# Patient Record
Sex: Female | Born: 1975 | Race: White | Hispanic: Yes | Marital: Single | Smoking: Never smoker
Health system: Southern US, Community
[De-identification: ages and names within clinical notes are randomized; demographics above are authoritative.]

---

## 2006-12-10 ENCOUNTER — Inpatient Hospital Stay (HOSPITAL_COMMUNITY): Admission: AD | Admit: 2006-12-10 | Discharge: 2006-12-14 | Payer: Self-pay | Admitting: Obstetrics

## 2006-12-11 ENCOUNTER — Encounter (INDEPENDENT_AMBULATORY_CARE_PROVIDER_SITE_OTHER): Payer: Self-pay | Admitting: Obstetrics

## 2008-03-01 ENCOUNTER — Inpatient Hospital Stay (HOSPITAL_COMMUNITY): Admission: AD | Admit: 2008-03-01 | Discharge: 2008-03-01 | Payer: Self-pay | Admitting: Obstetrics & Gynecology

## 2008-03-01 ENCOUNTER — Ambulatory Visit: Payer: Self-pay | Admitting: Physician Assistant

## 2008-05-24 ENCOUNTER — Encounter: Payer: Self-pay | Admitting: Family

## 2008-05-24 ENCOUNTER — Ambulatory Visit (HOSPITAL_COMMUNITY): Admission: RE | Admit: 2008-05-24 | Discharge: 2008-05-24 | Payer: Self-pay | Admitting: Obstetrics & Gynecology

## 2008-05-24 ENCOUNTER — Ambulatory Visit: Payer: Self-pay | Admitting: Obstetrics & Gynecology

## 2008-05-24 LAB — CONVERTED CEMR LAB
Clue Cells Wet Prep HPF POC: NONE SEEN
Hemoglobin: 12.1 g/dL (ref 12.0–15.0)
Hepatitis B Surface Ag: NEGATIVE
Lymphocytes Relative: 17 % (ref 12–46)
MCHC: 32.8 g/dL (ref 30.0–36.0)
Monocytes Absolute: 0.3 10*3/uL (ref 0.1–1.0)
Monocytes Relative: 4 % (ref 3–12)
Neutro Abs: 7.5 10*3/uL (ref 1.7–7.7)
RBC: 4.13 M/uL (ref 3.87–5.11)
Rh Type: POSITIVE
Rubella: >500 intl units/mL — ABNORMAL HIGH

## 2008-05-31 ENCOUNTER — Ambulatory Visit: Payer: Self-pay | Admitting: Obstetrics & Gynecology

## 2008-06-12 ENCOUNTER — Ambulatory Visit: Payer: Self-pay | Admitting: Family Medicine

## 2008-06-12 ENCOUNTER — Observation Stay (HOSPITAL_COMMUNITY): Admission: AD | Admit: 2008-06-12 | Discharge: 2008-06-12 | Payer: Self-pay | Admitting: Obstetrics & Gynecology

## 2008-06-13 ENCOUNTER — Ambulatory Visit: Payer: Self-pay | Admitting: Obstetrics & Gynecology

## 2008-06-13 ENCOUNTER — Inpatient Hospital Stay (HOSPITAL_COMMUNITY): Admission: AD | Admit: 2008-06-13 | Discharge: 2008-06-16 | Payer: Self-pay | Admitting: Obstetrics & Gynecology

## 2008-06-13 ENCOUNTER — Encounter: Payer: Self-pay | Admitting: Obstetrics & Gynecology

## 2010-05-21 LAB — COMPREHENSIVE METABOLIC PANEL
AST: 21 U/L (ref 0–37)
Albumin: 2.5 g/dL — ABNORMAL LOW (ref 3.5–5.2)
Chloride: 105 mEq/L (ref 96–112)
Creatinine, Ser: 0.59 mg/dL (ref 0.4–1.2)
GFR calc Af Amer: >60 mL/min (ref >60)
Potassium: 3.7 mEq/L (ref 3.5–5.1)
Total Bilirubin: 0.9 mg/dL (ref 0.3–1.2)
Total Protein: 6.3 g/dL (ref 6.0–8.3)

## 2010-05-21 LAB — URINALYSIS, ROUTINE W REFLEX MICROSCOPIC
Glucose, UA: NEGATIVE mg/dL
pH: 6 (ref 5.0–8.0)

## 2010-05-21 LAB — CBC
HCT: 25.8 % — ABNORMAL LOW (ref 36.0–46.0)
MCHC: 34.7 g/dL (ref 30.0–36.0)
MCV: 90.5 fL (ref 78.0–100.0)
MCV: 90.5 fL (ref 78.0–100.0)
Platelets: 191 10*3/uL (ref 150–400)
RBC: 2.84 MIL/uL — ABNORMAL LOW (ref 3.87–5.11)
RDW: 14.4 % (ref 11.5–15.5)
WBC: 11.2 10*3/uL — ABNORMAL HIGH (ref 4.0–10.5)

## 2010-05-21 LAB — URINE MICROSCOPIC-ADD ON

## 2010-05-22 LAB — POCT URINALYSIS DIP (DEVICE)
Glucose, UA: NEGATIVE mg/dL
Hgb urine dipstick: NEGATIVE
Nitrite: NEGATIVE
Nitrite: NEGATIVE
Protein, ur: NEGATIVE mg/dL
Protein, ur: NEGATIVE mg/dL
Specific Gravity, Urine: 1.015 (ref 1.005–1.030)
Urobilinogen, UA: 0.2 mg/dL (ref 0.0–1.0)
Urobilinogen, UA: 0.2 mg/dL (ref 0.0–1.0)
pH: 6.5 (ref 5.0–8.0)
pH: 7 (ref 5.0–8.0)

## 2010-05-27 LAB — WET PREP, GENITAL
Clue Cells Wet Prep HPF POC: NONE SEEN
Trich, Wet Prep: NONE SEEN
Yeast Wet Prep HPF POC: NONE SEEN

## 2010-05-27 LAB — URINALYSIS, ROUTINE W REFLEX MICROSCOPIC
Bilirubin Urine: NEGATIVE
Ketones, ur: NEGATIVE mg/dL
Nitrite: NEGATIVE
Urobilinogen, UA: 0.2 mg/dL (ref 0.0–1.0)

## 2010-05-27 LAB — URINE CULTURE

## 2010-05-27 LAB — URINE MICROSCOPIC-ADD ON

## 2010-06-06 IMAGING — US US OB LIMITED
1 series · 14 of 17 positions shown · non-contrast
Comparison: none

OBSTETRICAL ULTRASOUND:
 This ultrasound exam was performed in the [HOSPITAL] Ultrasound Department.  The OB US report was generated in the AS system, and faxed to the ordering physician.  This report is also available in [REDACTED] PACS.

[Series 1: us fetal bpp w/o nonstress · non-contrast · 17 acquisitions, 14 frames shown]
[im 1/17]
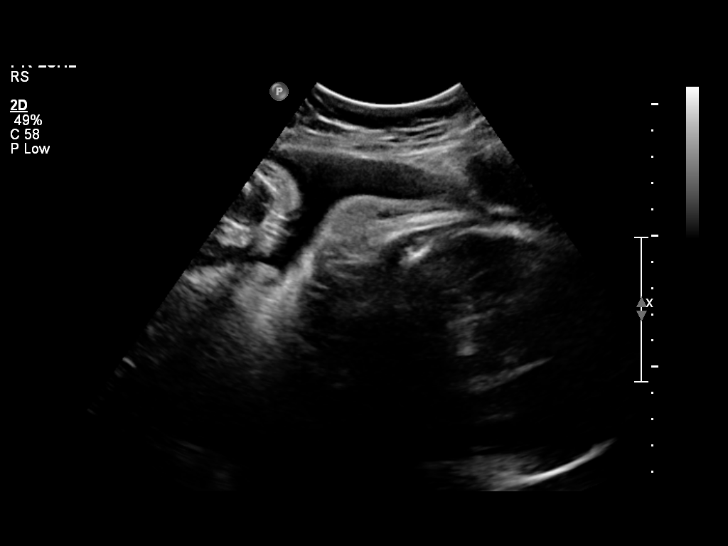
[im 2/17]
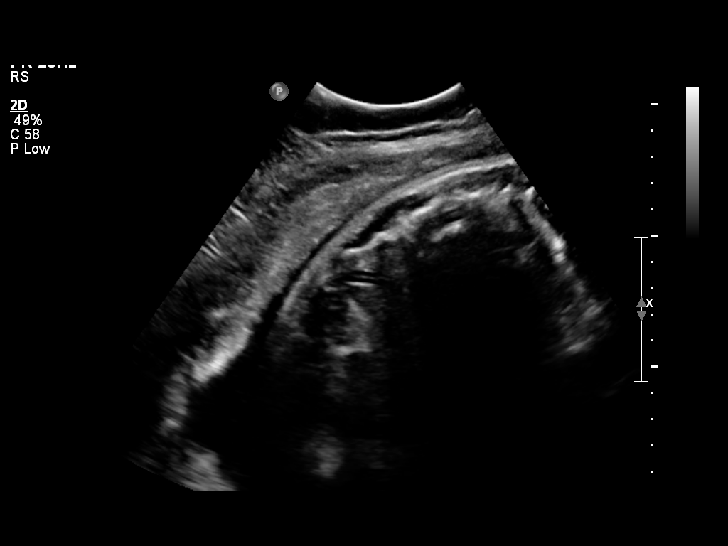
[im 4/17]
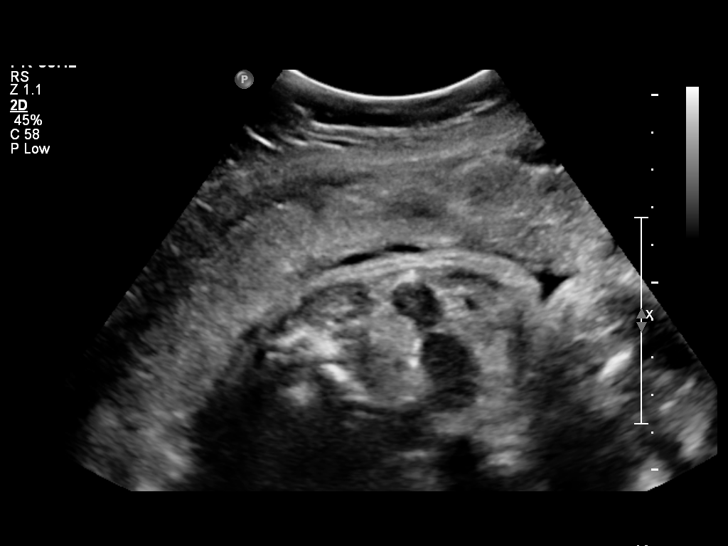
[im 5/17]
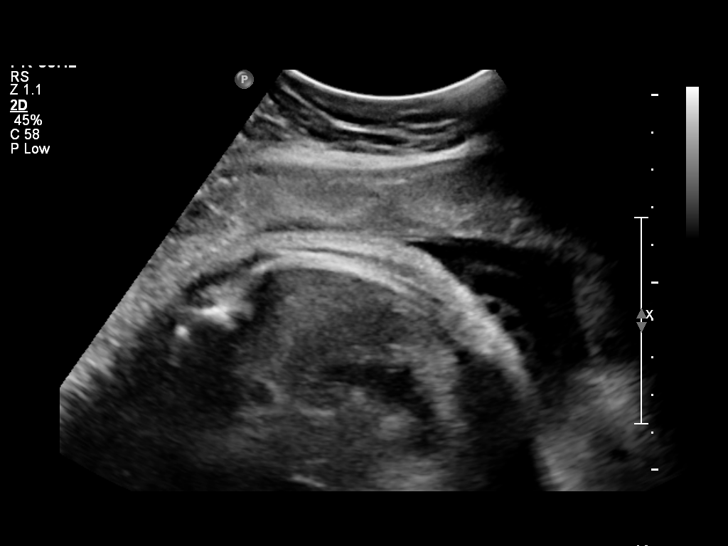
[im 6/17]
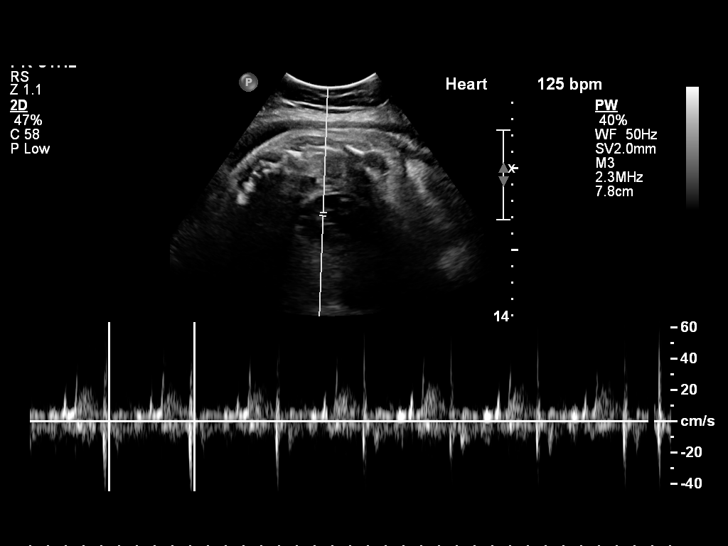
[im 7/17]
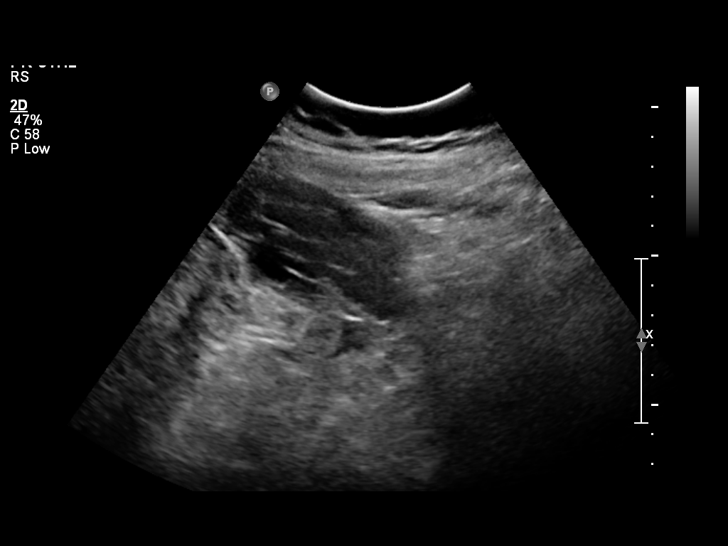
[im 8/17]
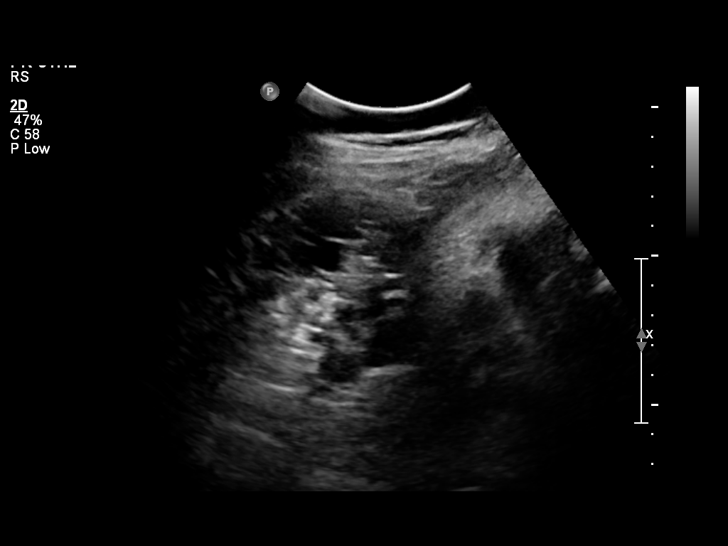
[im 10/17]
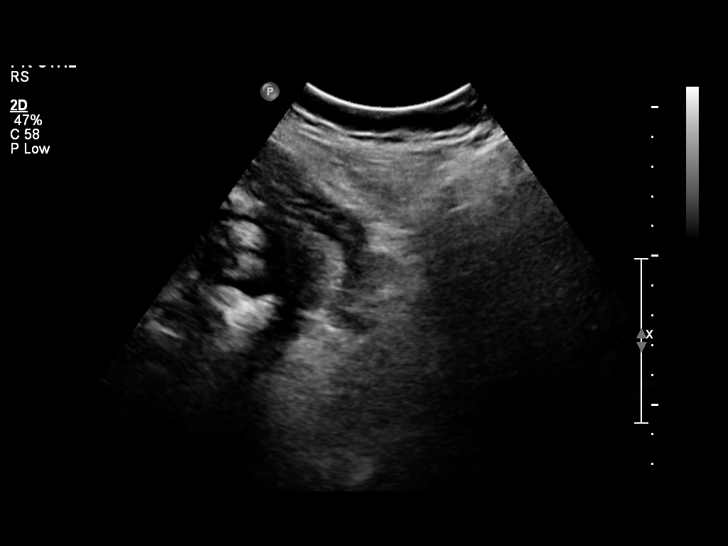
[im 11/17]
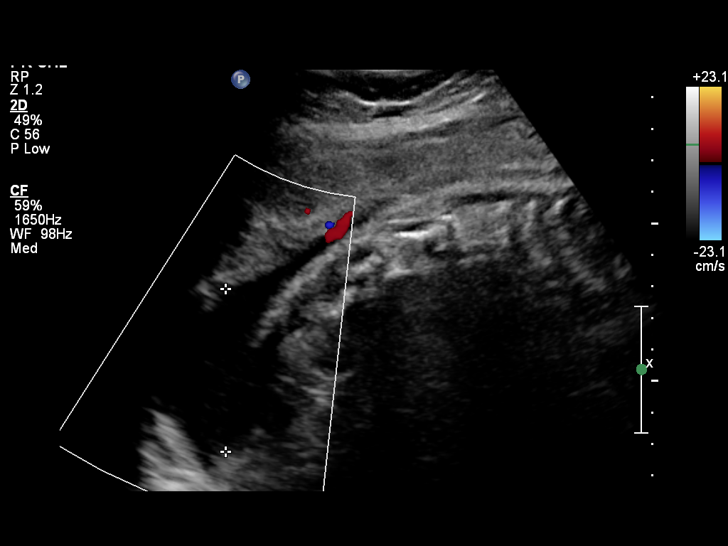
[im 12/17]
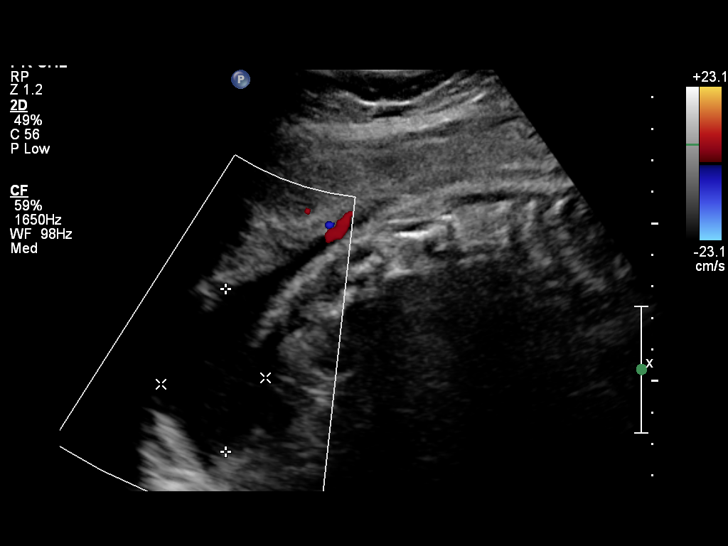
[im 13/17]
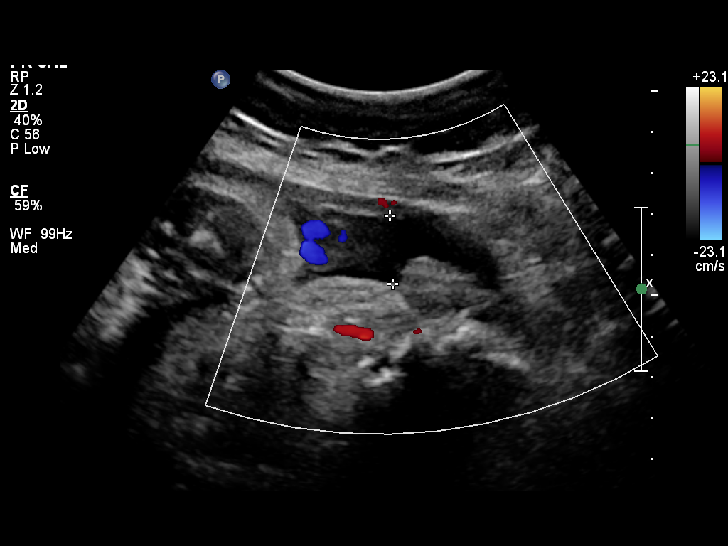
[im 14/17]
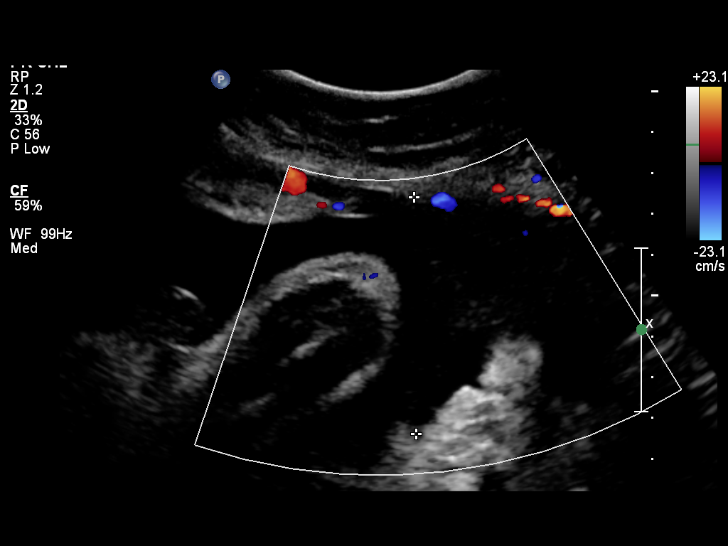
[im 16/17]
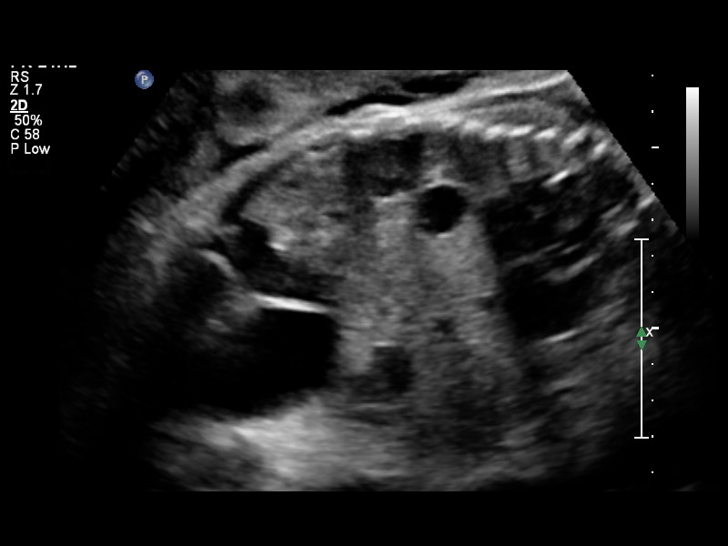
[im 17/17]
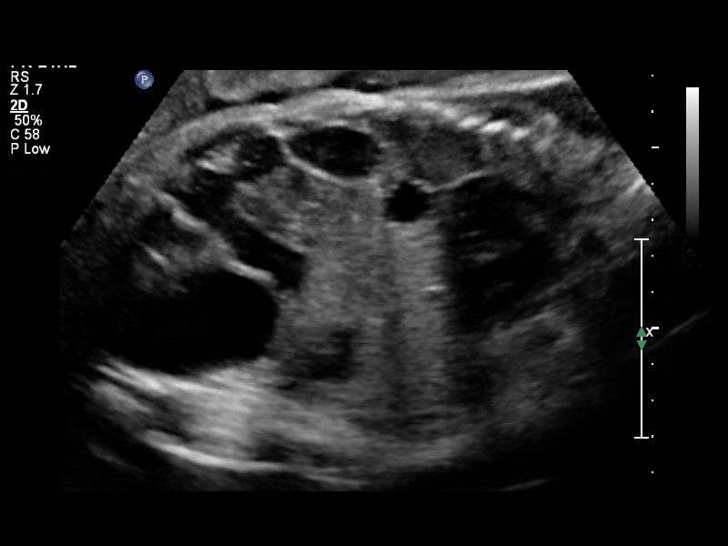

[14 of 17 positions shown; findings below may reference images not displayed]

IMPRESSION: See AS Obstetric US report.

## 2010-06-25 NOTE — Op Note (Signed)
Christy [name], Christy [name]       ACCOUNT NO.:  [id number]   MEDICAL RECORD NO.:  [id number]          PATIENT TYPE:  INP   LOCATION:  9164                          FACILITY:  [facility]   PHYSICIAN:  Kathreen Cosier, M.D.DATE OF BIRTH:  Jun 05, 1975   DATE OF PROCEDURE:  12/11/2006  DATE OF DISCHARGE:                               OPERATIVE REPORT   PREOPERATIVE DIAGNOSIS:  Intrauterine pregnancy at term with prolonged  ruptured membranes and failure to progress in labor.   POSTOPERATIVE DIAGNOSIS:  Intrauterine pregnancy at term with prolonged  ruptured membranes and failure to progress in labor.   SURGEON:  Kathreen Cosier, M.D.   ANESTHESIA:  Epidural.   DESCRIPTION OF PROCEDURE:  The patient was placed on the operating room  in the supine position, abdomen prepped and draped, bladder emptied with  a Foley catheter.  A transverse suprapubic incision was made and carried  down to the rectus fascia, fascia cleaned and incised the length of the  incision.  Rectus muscles were retracted laterally and peritoneum  incised longitudinally.  A transverse incision was made in the  visceroperitoneum above the bladder and bladder mobilized inferiorly.  A  transverse lower uterine incision was made and the patient delivered  from the OP position of a female, Apgars 8/9, weighing 6 pounds 1 ounce.  The team was in attendance.  The placenta was anterior and removed  manually and the uterine cavity cleaned with dry laps.  The uterine  incision was closed in 1 layer with a continuous suture of #1 chromic;  hemostasis was satisfactory.  The bladder flap was reattached with 2-0  chromic.  The uterus was well contracted.  Tubes and ovaries were  normal.  Abdomen was closed in layers, the peritoneum with a continuous  suture of 0 chromic, fascia with a continuous suture of 0 Dexon and the  skin closed with a subcuticular stitch of 4-0 Monocryl.  Blood loss was  600 mL.  The patient tolerated the  procedure well and taken to the  recovery room in good condition.           ______________________________  Kathreen Cosier, M.D.     BAM/MEDQ  D:  12/11/2006  T:  12/13/2006  Job:  [id number]

## 2010-06-25 NOTE — H&P (Signed)
Christy [name], Christy [name]       ACCOUNT NO.:  [id number]   MEDICAL RECORD NO.:  [id number]          PATIENT TYPE:  INP   LOCATION:  9164                          [facility]   PHYSICIAN:  Kathreen Cosier, M.D.DATE OF BIRTH:  1975-08-03   DATE OF ADMISSION:  12/10/2006  DATE OF DISCHARGE:                              HISTORY & PHYSICAL   HISTORY OF PRESENT ILLNESS:  The patient is a 35 year old gravida 1, EDC  December 22, 2006.  She was admitted with a history of ruptured  membranes on the morning of December 10, 2006.  Membranes ruptured at  7:30 p.m. December 09, 2006.  She has positive GBS.  She was having  occasional contractions.  The cervix was 1 cm, 80% vertex, -2 to -3  station.  An IUPC was inserted at that time, and she was started on low-  dose Pitocin.  By 5:15 p.m. on October 30, she was 3 cm, 80% vertex -2  on 60 milliunits of Pitocin and adequate labor.  At 1:15 a.m. on October  31, cervix was unchanged.  It was 4 cm, 100%, -0 station.  She had a few  late decelerations at that time, and they resolved on their own.  At  6:10 a.m. on October 31, she was still 4 cm, 100% , 0 station.  IUPC was  replaced 10:45 a.m. and she was started on high dose Pitocin.  By 2:00  p.m. there was no change in the cervix.  It was decided she would be  delivered by C-section for failure to progress in labor, prolonged  ruptured membranes.  PHYSICAL EXAMINATION/>  GENERAL:  Reveals a well-developed female in no distress.  HEENT: Negative.  LUNGS: Clear.  HEART:  Regular rhythm.  No murmurs or gallops.  BREASTS: No masses.  ABDOMEN: Term size uterus.  Estimated fetal weight 6 pounds 12 ounces.  PELVIC:  As described above.  EXTREMITIES:  Negative.           ______________________________  Kathreen Cosier, M.D.     BAM/MEDQ  D:  12/11/2006  T:  12/13/2006  Job:  [id number]

## 2010-06-25 NOTE — Discharge Summary (Signed)
NAMEDESHANTA, [name]       ACCOUNT NO.:  [id number]   MEDICAL RECORD NO.:  [id number]          PATIENT TYPE:  INP   LOCATION:  9128                          [facility]   PHYSICIAN:  Allie Bossier, MD        DATE OF BIRTH:  12-03-1975   DATE OF ADMISSION:  06/13/2008  DATE OF DISCHARGE:  06/16/2008                               DISCHARGE SUMMARY   DISCHARGING PHYSICIAN:  Scheryl Darter, MD   SURGEON:  Norton Blizzard, MD   ADMISSION DIAGNOSES:  1. Intrauterine pregnancy at 40 and 2/7th weeks.  2. Active labor.  3. Elevated blood pressures.  4. Previous C-section.   DISCHARGE DIAGNOSES:  1. Intrauterine pregnancy at 40 and 2/7th weeks.  2. Active labor.  3. Elevated blood pressures.  4. Previous C-section.  5. Negative preeclampsia workup.  6. Nonreassuring fetal heart rate tracing.  7. Status post repeat low transverse cesarean section of a female      infant.  8. Tight nuchal cord x1 and meconium-stained fluid.  9. Infant for adoption.   HOSPITAL PROCEDURES:  1. Electronic fetal monitoring.  2. Epidural anesthesia.  3. Pitocin augmentation of labor.  4. Repeat low transverse cesarean section.   HOSPITAL COURSE:  The patient was admitted in active labor with cervix  of 3-4 cm.  Amniotomy was performed.  Epidural was placed for pain  control.  PIH labs were done and were normal with no proteinuria.  She  progressed to 4 cm and IUPC was placed.  Pitocin was started for  augmentation of labor.  Decelerations occurred in the afternoon of Jun 13, 2008, and the usual measures were employed to treat those and the  nonreassuring fetal heart rate pattern continued with cervical progress  only to 4 cm and decision was made after a longer decelerations to  proceed with a repeat low transverse cesarean section for a viable female  infant with a tight nuchal cord x1 and meconium-stained fluid.  Apgars  were 9 and 10.  Cord pH was 7.32.  Baby weighed 6 pounds and 10 ounces.  She  was taken to recovery and then to Mother-baby, where she received  routine postop care.  On postop day #1, her pain was well controlled.  Hemoglobin was 8.9.  Blood pressures were normal.  She had a social work  consult regarding her placement of the baby for adoption.  Arrangements  had already been made through [place] adoption services.  On postop  day #2, the patient continued to do well.  Pain was well-controlled with  Percocet.  Blood pressure was 125/76.  Exam was stable.  Routine care  was continued on postop day #3.  The patient was ready to go home.  Blood pressures were within normal limits.  Chest was clear.  Heart is  regular rate and rhythm.  Abdomen was soft and appropriately tender.  Incision was clean, dry, and intact with staples.  Lochia was small.  Extremities were normal.  Staples will be removed prior to discharge.  The patient expressed desire to have Depo-Provera prior to discharge and  continues with her plan for  adoption.  She was deemed to receive full  benefit of her hospital stay and was discharged home.   DISCHARGE MEDICATIONS:  1. Percocet 1-2 p.o. q.4 h. p.r.n.  2. Ibuprofen 600 mg p.o. q.6 h. p.r.n.  3. Prenatal vitamin 1 p.o. daily.  4. Colace 1-2 p.o. daily.  5. Iron sulfate daily for anemia.   DISCHARGE LABORATORY DATA:  Urine was negative for protein.  Sodium 133,  potassium 3.7, creatinine 0.59, AST 21, and ALT 17.  White blood cell  count is 15.2, hemoglobin 8.9, and platelets 136.   DISCHARGE INSTRUCTIONS:  Per handout as per routine.   DISCHARGE FOLLOWUP:  Discharge followup in 6 weeks at [facility] or  [facility] as indicated.   CONDITION AT DISCHARGE:  Good.      Marie L. Williams, C.N.M.      Allie Bossier, MD  Electronically Signed    MLW/MEDQ  D:  06/16/2008  T:  06/16/2008  Job:  [id number]

## 2010-06-25 NOTE — Op Note (Signed)
Christy [name], Christy [name]       ACCOUNT NO.:  [id number]   MEDICAL RECORD NO.:  [id number]          PATIENT TYPE:  INP   LOCATION:  9128                          [facility]   PHYSICIAN:  Norton Blizzard, MD    DATE OF BIRTH:  [birth year]   DATE OF PROCEDURE:  06/13/2008  DATE OF DISCHARGE:                               OPERATIVE REPORT   PREOPERATIVE DIAGNOSES:  1. Intrauterine pregnancy at 40-2/7th weeks gestation.  2. Fetal distress.   POSTOPERATIVE DIAGNOSES:  1. Intrauterine pregnancy at 40-2/7th weeks gestation.  2. Fetal distress.   PROCEDURE:  Repeat low transverse cesarean section via Pfannenstiel.   SURGEON:  Norton Blizzard, MD   ANESTHESIA:  Epidural.   IV FLUIDS:  1500 mL of lactated Ringer's.   ESTIMATED BLOOD LOSS:  800 mL.   URINE OUTPUT:  900 mL of clear yellow urine.   INDICATIONS:  The patient is a 35 year old gravida 2, para 1 at 40-2/7th  weeks gestation who was admitted with spontaneous onset of labor.  The  patient does have a history of a prior cesarean section and opted for  trial of labor after cesarean section for which she was consented in the  office.  Her labor was complicated by episodes of decelerations down to  the 60s lasting less than about 1 minute, that resolved with position  change, and returned to a baseline of 120s and reactive.  She did have a  prolonged deceleration during emesis which resolved to a reactive fetal  heart rate changing in the 120s.  Her cervical exam, advanced from 3 cm  initially to 4 cm, but there was no further cervical change and even  though an intrauterine pressure catheter was placed, she was not  adequate for a long period of time with average MVUs of 150s, and also  because the Pitocin was turned off and on a few times secondary to the  decelerations.  However, around 1820, she had a prolonged 6 minute  deceleration to the 50s with brief return to baseline for two 1 minute  segments within the  deceleration, but the heart rate remained down and  could not resolve after several measures.  Her cervix was still noted to  be 4/80/-2.  She was given a dose of terbutaline 0.25 mg and an urgent  cesarean section was called for fetal distress.  She was counseled about  the risks of cesarean section including but not limited to: bleeding  which might require transfusion, infection which might require  antibiotics, injury to surrounding internal organs, need for additional  procedures including hysterectomy in the event of a life-threatening  bleed and written informed consent was obtained with the aid of a  Spanish interpreter.  The patient was urgently taken to the operating  room and in the operating room, the fetal heart was noted to be back to  the baseline of 130s with moderate variability for at least 2 minutes,  but the decision was made to proceed with cesarean section given the  recurrent periods of nonreassuring fetal heart rate tracing.   FINDINGS:  Viable female infant in a cephalic presentation.  There was 1  tight nuchal cord and heavy meconium-stained fluid.  Apgars were 9 and  10, arterial cord pH was 7.32, weight 6 pounds 10 ounces.  There was  dense adhesions of the omentum to the lower uterine segment on the left  side of the uterus.  There was an intact placenta with three-vessel  cord.  Normal uterus, tubes, and ovaries bilaterally.   SPECIMENS:  Placenta and cord blood.   DISPOSITION:  Specimens to Pathology.   COMPLICATIONS:  None immediately after procedure.   PROCEDURE DETAILS:  The patient was urgently taken from her labor  delivery room to the OR for nonreassuring fetal heart rate tracing.  In  the operating room, the patient was noted have a fetal heart rate  tracing in the 130s that lasted for about 2 minutes.  Given her  recurrent episodes of concerning decelerations and inability to titrate  pitocin to get to adequate contractions secondary to the  decelerations,  the decision was made to proceed with a repeat cesarean section.  She  received 1 g of Ancef intravenously for surgical site infection  prophylaxis.  The patient was placed in a dorsal supine position, and  prepped and draped in a sterile manner.  A Pfannenstiel incision was  made over her preexisting scar with a scalpel and carried through to the  underlying layer of the fascia.  The fascia was incised in the midline,  and this incision was extended bilaterally using Mayo scissors.  Kochers  were then applied to the superior aspect of the fascial incision, and  the underlying rectus muscles were dissected off bluntly and sharply.  A  similar process was carried out on the inferior aspect.  The rectus  muscles were separated in the midline bluntly and the peritoneum was  entered bluntly.  The peritoneal incision was extended superiorly and  inferiorly with care given to avoid bowel or bladder injury.  Attention  was turned to the lower uterine segment, where dense adhesions of the  omentum were noted involving the left side of the lower uterine segment  and also the entire left side of the uterus.  These adhesions were taken  down using electrocautery and suture ligation and good hemostasis was  noted.  A transverse hysterotomy was then made in the lower uterine  segment with the scalpel and extended bilaterally in a blunt fashion.  The infant's head was encountered and delivered.  There was a tight  nuchal cord that was reduced with some difficulty and rest of the  infant's corpus was delivered without complication.  The cord was  clamped and cut, and the infant was handed over to the awaiting  neonatology team.  Heavy meconium staining of the amniotic fluid was  noted.  An arterial cord pH blood sample was obtained.  Fundal massage  was administered, and the placenta delivered intact with three-vessel  cord noted.  The uterus was then cleared of all clots and debris using  a  dry laparotomy sponge.  The hysterotomy was repaired using 0 Monocryl in  a running interlocking fashion.  A second layer of the same suture was  used as an imbricating layer.  Overall, good hemostasis was noted.  The  pelvic gutters were cleared off all clots and debris.  The peritoneum  and the muscles were closed in a mass fashion using interrupted stitches  of 0 Vicryl.  The fascia was reapproximated using 0 PDS in a running  stitch.  The subcutaneous layer  was copiously irrigated, some bleeders  were controlled using electrocautery, and the skin was closed with  staples.  The patient tolerated the procedure well.  Sponge, instrument,  and needle counts were correct x2.  She was taken to the recovery room  in stable condition.      Norton Blizzard, MD  Electronically Signed     UAD/MEDQ  D:  06/13/2008  T:  06/14/2008  Job:  [id number]

## 2010-06-28 NOTE — Discharge Summary (Signed)
NAMEIMOJEAN, YOSHINO       ACCOUNT NO.:  [id number]   MEDICAL RECORD NO.:  [id number]          PATIENT TYPE:  INP   LOCATION:  9118                          [facility]   PHYSICIAN:  Kathreen Cosier, M.D.DATE OF BIRTH:  [birth year]   DATE OF ADMISSION:  12/10/2006  DATE OF DISCHARGE:  12/14/2006                               DISCHARGE SUMMARY   The patient is a 35 year old primigravida, [facility] December 22, 2006, with a  history of ruptured membranes from the night prior to admission.  She  was having occasional contractions.  She had a positive GBS.  On  admission her cervix was 1 cm, 80% vertex, -2 to -3, and she was started  on Pitocin.  She eventually underwent a primary low transverse cesarean  section because of failure to progress in labor and dysfunctional labor.  She had a female, Apgar 8 and 9, weighing 6 pounds 1 ounce from the OP  position.  Postoperatively, she did well.  On admission her hemoglobin  was 11.9, postoperatively 8.7; platelets 227, 171.  RPR negative, HIV  negative, urine negative.  She was discharged on the third postoperative  day ambulatory, on a regular diet, on Tylox for pain, to see me in 6  weeks.   DISCHARGE DIAGNOSIS:  Status post intrauterine pregnancy at term with  prolonged ruptured membranes, failure to progress in labor.           ______________________________  Kathreen Cosier, M.D.     BAM/MEDQ  D:  01/06/2007  T:  01/06/2007  Job:  [id number]

## 2010-11-19 LAB — CBC
HCT: 24.1 — ABNORMAL LOW
Hemoglobin: 8.1 — ABNORMAL LOW
MCHC: 35.2
MCV: 88.3
MCV: 89.2
Platelets: 164
Platelets: 171
RDW: 14.9 — ABNORMAL HIGH
RDW: 15.3 — ABNORMAL HIGH
WBC: 12.1 — ABNORMAL HIGH

## 2010-11-19 LAB — URINE MICROSCOPIC-ADD ON

## 2010-11-19 LAB — DIFFERENTIAL
Basophils Absolute: 0
Basophils Relative: 0
Eosinophils Absolute: 0
Lymphocytes Relative: 5 — ABNORMAL LOW
Monocytes Absolute: 0.3
Neutrophils Relative %: 93 — ABNORMAL HIGH

## 2010-11-19 LAB — URINALYSIS, ROUTINE W REFLEX MICROSCOPIC
Bilirubin Urine: NEGATIVE
Ketones, ur: NEGATIVE
Specific Gravity, Urine: 1.01
pH: 7

## 2010-11-19 LAB — URINE CULTURE
Colony Count: NO GROWTH
Culture: NO GROWTH
Special Requests: NEGATIVE

## 2010-11-20 LAB — RPR: RPR Ser Ql: NONREACTIVE

## 2010-11-20 LAB — CBC
Hemoglobin: 11.6 — ABNORMAL LOW
MCHC: 34.2
MCHC: 34.3
MCV: 87.9
RBC: 3.83 — ABNORMAL LOW
RBC: 3.96
RDW: 14.7 — ABNORMAL HIGH
WBC: 14.5 — ABNORMAL HIGH

## 2016-07-19 ENCOUNTER — Encounter (HOSPITAL_COMMUNITY): Payer: Self-pay

## 2016-08-26 ENCOUNTER — Other Ambulatory Visit: Payer: Self-pay | Admitting: Obstetrics & Gynecology

## 2016-08-26 DIAGNOSIS — Z1231 Encounter for screening mammogram for malignant neoplasm of breast: Secondary | ICD-10-CM

## 2016-09-02 ENCOUNTER — Ambulatory Visit
Admission: RE | Admit: 2016-09-02 | Discharge: 2016-09-02 | Disposition: A | Discharge status: Home / Self Care | Payer: No Typology Code available for payment source | Source: Ambulatory Visit | Attending: Obstetrics & Gynecology | Admitting: Obstetrics & Gynecology

## 2016-09-02 DIAGNOSIS — Z1231 Encounter for screening mammogram for malignant neoplasm of breast: Secondary | ICD-10-CM

## 2016-09-04 ENCOUNTER — Other Ambulatory Visit: Payer: Self-pay | Admitting: Obstetrics & Gynecology

## 2016-09-04 DIAGNOSIS — R928 Other abnormal and inconclusive findings on diagnostic imaging of breast: Secondary | ICD-10-CM

## 2016-09-16 ENCOUNTER — Other Ambulatory Visit: Payer: Self-pay | Admitting: Obstetrics and Gynecology

## 2016-09-16 ENCOUNTER — Other Ambulatory Visit: Payer: Self-pay | Admitting: Obstetrics & Gynecology

## 2016-09-16 DIAGNOSIS — R928 Other abnormal and inconclusive findings on diagnostic imaging of breast: Secondary | ICD-10-CM

## 2016-09-30 ENCOUNTER — Encounter (HOSPITAL_COMMUNITY): Payer: Self-pay

## 2016-09-30 ENCOUNTER — Ambulatory Visit (HOSPITAL_COMMUNITY)
Admission: RE | Admit: 2016-09-30 | Discharge: 2016-09-30 | Disposition: A | Discharge status: Home / Self Care | Payer: Self-pay | Source: Ambulatory Visit | Attending: Obstetrics and Gynecology | Admitting: Obstetrics and Gynecology

## 2016-09-30 ENCOUNTER — Ambulatory Visit
Admission: RE | Admit: 2016-09-30 | Discharge: 2016-09-30 | Disposition: A | Discharge status: Home / Self Care | Payer: No Typology Code available for payment source | Source: Ambulatory Visit | Attending: Obstetrics and Gynecology | Admitting: Obstetrics and Gynecology

## 2016-09-30 VITALS — BP 154/86 | HR 80 | Temp 98.4°F | Ht 62.5 in | Wt 150.2 lb

## 2016-09-30 DIAGNOSIS — R928 Other abnormal and inconclusive findings on diagnostic imaging of breast: Secondary | ICD-10-CM

## 2016-09-30 DIAGNOSIS — Z1239 Encounter for other screening for malignant neoplasm of breast: Secondary | ICD-10-CM

## 2016-09-30 NOTE — Patient Instructions (Signed)
Explained breast self awareness with Smitty Knudsen. Patient did not need a Pap smear today due to last Pap smear was in June 2018 per patient. Let her know BCCCP will cover Pap smears every 3 years unless has a history of abnormal Pap smears. Referred patient to the [facility] for right diagnostic mammogram and breast ultrasound per recommendation. Appointment scheduled for Tuesday, September 30, 2016 at 0850. Smitty Knudsen verbalized understanding.  Gil Ingwersen, Kathaleen Maser, RN 8:44 AM

## 2016-09-30 NOTE — Progress Notes (Signed)
Patient referred to [facility] by the [facility] for additional imaging of the right breast. Screening mammogram completed 09/02/2016.   Pap Smear: Pap smear not completed today. Last Pap smear was in June 2018 at the [facility] and normal. Per patient has no history of an abnormal Pap smear. No Pap smear results are in EPIC.  Physical exam: Breasts Breasts symmetrical. No skin abnormalities bilateral breasts. No nipple retraction bilateral breasts. No nipple discharge bilateral breasts. No lymphadenopathy. No lumps palpated bilateral breasts. No complaints of pain or tenderness on exam. Referred patient to the [facility] for right diagnostic mammogram and breast ultrasound per recommendation. Appointment scheduled for Tuesday, September 30, 2016 at 0850.        Pelvic/Bimanual No Pap smear completed today since last Pap smear was in June 2018 per patient. Pap smear not indicated per BCCCP guidelines.   Smoking History: Patient has never smoked.  Patient Navigation: Patient education provided. Access to services provided for patient through [facility] program. Spanish interpreter provided.  Used Spanish interpreter [employer] from [place].

## 2017-09-02 ENCOUNTER — Other Ambulatory Visit: Payer: Self-pay | Admitting: Obstetrics and Gynecology

## 2017-09-02 DIAGNOSIS — Z1231 Encounter for screening mammogram for malignant neoplasm of breast: Secondary | ICD-10-CM

## 2017-09-10 ENCOUNTER — Ambulatory Visit (INDEPENDENT_AMBULATORY_CARE_PROVIDER_SITE_OTHER): Payer: Self-pay

## 2017-09-10 DIAGNOSIS — Z3202 Encounter for pregnancy test, result negative: Secondary | ICD-10-CM

## 2017-09-10 NOTE — Progress Notes (Signed)
Video Interpreter # [id number] Pt here today for pregnancy test resulted negative.  Pt stated that she took at home tests that showed both negative and positive "I want to know what's going on".  I informed pt that unfortunately her test came back negative and that I encouraged her to take a digital pregnancy test in a week.  I also informed her that there are several reasons on why her period could be late.  Pt stated understanding with no further questions.

## 2017-09-11 LAB — POCT PREGNANCY, URINE: PREG TEST UR: NEGATIVE

## 2017-09-14 NOTE — Progress Notes (Signed)
I have reviewed the chart and agree with nursing staff's documentation of this patient's encounter.  La Dibella, MD 09/14/2017 11:02 AM    

## 2017-10-29 ENCOUNTER — Ambulatory Visit
Admission: RE | Admit: 2017-10-29 | Discharge: 2017-10-29 | Disposition: A | Discharge status: Home / Self Care | Payer: No Typology Code available for payment source | Source: Ambulatory Visit | Attending: Obstetrics and Gynecology | Admitting: Obstetrics and Gynecology

## 2017-10-29 ENCOUNTER — Ambulatory Visit (HOSPITAL_COMMUNITY)
Admission: RE | Admit: 2017-10-29 | Discharge: 2017-10-29 | Disposition: A | Discharge status: Home / Self Care | Payer: Self-pay | Source: Ambulatory Visit | Attending: Obstetrics and Gynecology | Admitting: Obstetrics and Gynecology

## 2017-10-29 ENCOUNTER — Encounter (HOSPITAL_COMMUNITY): Payer: Self-pay

## 2017-10-29 VITALS — BP 118/76 | Wt 150.6 lb

## 2017-10-29 DIAGNOSIS — Z1239 Encounter for other screening for malignant neoplasm of breast: Secondary | ICD-10-CM

## 2017-10-29 DIAGNOSIS — Z1231 Encounter for screening mammogram for malignant neoplasm of breast: Secondary | ICD-10-CM

## 2017-10-29 NOTE — Progress Notes (Signed)
No complaints today.   Pap Smear: Pap smear not completed today. [facility] and normal per patient. Per patient has no history of an abnormal Pap smear. No Pap smear results are in Epic.  Physical exam: Breasts Breasts symmetrical. No skin abnormalities bilateral breasts. No nipple retraction bilateral breasts. No nipple discharge bilateral breasts. No lymphadenopathy. No lumps palpated bilateral breasts. No complaints of pain or tenderness on exam. [facility]. Appointment scheduled for Thursday, October 29, 2017 at 1240.        Pelvic/Bimanual No Pap smear completed today since last Pap smear was in June 2018 per patient. Pap smear not indicated per BCCCP guidelines.   Smoking History: Patient has never smoked.  Patient Navigation: Patient education provided. [facility]. Spanish interpreter provided.   Breast and Cervical Cancer Risk Assessment: Patient has no family history of breast cancer, known genetic mutations, or radiation treatment to the chest before age 41. Patient has no history of cervical dysplasia, immunocompromised, or DES exposure in-utero.  Risk Assessment    Risk Scores      10/29/2017   Last edited by: Lynnell DikeHolland, Sabrina H, LPN   5-year risk: 0.6 %   Lifetime risk: 9.6 %          Used Spanish interpreter Natale LayErika McReynolds from [employer].

## 2017-10-29 NOTE — Patient Instructions (Signed)
Explained breast self awareness with [name]. Patient did not need a Pap smear today due to last Pap smear was in June 2018 per patient. Let her know BCCCP will cover Pap smears every 3 years unless has a history of abnormal Pap smears. [facility] for a screening mamm[facility]. Appointment scheduled for Thursday, October 29, 2017 at 1240. Let patient know will follow up with her within the next couple weeks with results of mammogram by letter or phone. [name] verbalized understanding.  Desirie Minteer, Kathaleen Maserhristine Poll, RN 10:43 AM

## 2017-10-30 ENCOUNTER — Encounter (HOSPITAL_COMMUNITY): Payer: Self-pay | Admitting: *Deleted

## 2017-11-02 ENCOUNTER — Other Ambulatory Visit (HOSPITAL_COMMUNITY): Payer: Self-pay | Admitting: *Deleted

## 2017-11-02 DIAGNOSIS — Z0000 Encounter for general adult medical examination without abnormal findings: Secondary | ICD-10-CM

## 2017-11-04 ENCOUNTER — Inpatient Hospital Stay: Payer: No Typology Code available for payment source

## 2017-11-04 ENCOUNTER — Inpatient Hospital Stay: Payer: No Typology Code available for payment source | Attending: Obstetrics and Gynecology | Admitting: *Deleted

## 2017-11-04 VITALS — BP 110/65 | Ht 62.0 in | Wt 149.0 lb

## 2017-11-04 DIAGNOSIS — Z0000 Encounter for general adult medical examination without abnormal findings: Secondary | ICD-10-CM

## 2017-11-04 LAB — HEMOGLOBIN A1C
Hgb A1c MFr Bld: 5.3 % (ref 4.8–5.6)
Mean Plasma Glucose: 105.41 mg/dL

## 2017-11-04 LAB — LIPID PANEL
CHOL/HDL RATIO: 3.1 ratio
Cholesterol: 193 mg/dL (ref 0–200)
HDL: 63 mg/dL (ref >40)
LDL CALC: 100 mg/dL — AB (ref 0–99)
Triglycerides: 149 mg/dL (ref <150)
VLDL: 30 mg/dL (ref 0–40)

## 2017-11-04 NOTE — Addendum Note (Signed)
Addended by: Tyson DenseAYLOR, Marialy Urbanczyk B on: 11/04/2017 11:50 AM   Modules accepted: Orders

## 2017-11-04 NOTE — Progress Notes (Signed)
[facility] initial screening  Spanish Interpreter -Ladell Pier, Language Resources  Clinical Measurement:  Height:  62in Weight: 149lb  Blood Pressure:  110/70 Blood Pressure #2: 110/65   Fasting Labs Drawn Today, will review with patient when they result.  Medical History:  Patient states that she has not been diagnosed with high cholesterol, high blood pressure, diabetes or heart disease.  Medications:  Patients states she is not taking any medications for high cholesterol, high blood pressure or diabetes.  She is not taking aspirin daily to prevent heart attack or stroke.    Blood pressure, self measurement:  Patients states she does not measure blood pressure at home.    Nutrition:  Patient states she eats 3 cups of fruit and 3 cups of vegetables in an average day.  Patient states she does  eat fish regularly, she eats about a  half a serving of whole grains daily. She drinks less than 36 ounces of beverages with added sugar weekly.  She is not currently watching her sodium intake.  She has not had any drinks containing alcohol in the last seven days.    Physical activity:  Patient states that she gets 600 minutes of moderate exercise in a week.  She gets 100 minutes of vigorous exercise per week.   Smoking status:  Patient states she has never smoked and is not around any smokers.     Quality of life:  Patient states that she has had 0 bad physical days out of the last 30 days. In the last 2 weeks, she has had 0 days that she has felt down or depressed. She has had 0 days in the last 2 weeks that she has had little interest or pleasure in doing things.  Risk reduction and counseling:  Patient states she wants to lose weight and increase fruit and vegetable intake.  I encouraged her to continue with current exercise regimen and increase vegetable and fruit intake.  Navigation:  I will notify patient of lab results.  Patient is aware of 2 more health coaching sessions and a follow up.

## 2017-11-04 NOTE — Addendum Note (Signed)
Addended by: Rowan Blaker B on: 11/04/2017 11:50 AM   Modules accepted: Orders  

## 2017-11-06 ENCOUNTER — Telehealth (HOSPITAL_COMMUNITY): Payer: Self-pay | Admitting: *Deleted

## 2017-11-06 NOTE — Telephone Encounter (Signed)
Health coaching 2   Spanish interpreter-  [name], [employer]  Labs-LDL cholesterol 100, cholesterol 193, triglycerides, HDL cholesterol 63, hemoglobin A1C 5.3,mean plasma glucose 105.41  Patient is aware and understands her lab results.  Goals-  Patient states she walks and runs daily.  She eats more vegetables and fruit about  6 daily.  I encouraged the patient to continue with her exercise regimen and continuing to eat the amount of fruits and vegetables.  I encouraged her to eat heart healthy oils such as olive and canola oil.  Navigation: Patient is aware of 1 more health coaching sessions and a follow up.  Time- 10 minutes

## 2017-12-31 ENCOUNTER — Telehealth (HOSPITAL_COMMUNITY): Payer: Self-pay | Admitting: *Deleted

## 2017-12-31 NOTE — Telephone Encounter (Signed)
Health Coaching 3   Spanish interpreter- Celesta AverLucia De [name], Language [employer]   Goals-  Patient states that she is running 45 minutes 3 times per week.  Patent states that eat a lot of fruits ,vegetables, fish (cod) and grilled chicken.  Patient states that she is avoiding sugar except when she has a cup of coffee every other day. I encouraged patient to continue her healthy eating behaviors and exercise regimen. Patient states that she has lost 20lbs in 2 months.  New goal- Patient states that she wants to lose an additional  10lbs.   Barrier to reaching goal- N/A  Strategies to overcome barriers- N/A  Navigation:  Patient is aware of  a follow up session.  Time- 10 minutes

## 2019-10-04 ENCOUNTER — Other Ambulatory Visit: Payer: Self-pay | Admitting: Obstetrics and Gynecology

## 2019-10-04 DIAGNOSIS — Z1231 Encounter for screening mammogram for malignant neoplasm of breast: Secondary | ICD-10-CM

## 2019-10-23 IMAGING — MG DIGITAL SCREENING BILATERAL MAMMOGRAM WITH TOMO AND CAD
8 series · 9 of 24 positions shown · non-contrast
Comparison: Previous exam(s).

CLINICAL DATA: Screening.

EXAM:
DIGITAL SCREENING BILATERAL MAMMOGRAM WITH TOMO AND CAD

[R CC synth-2D]
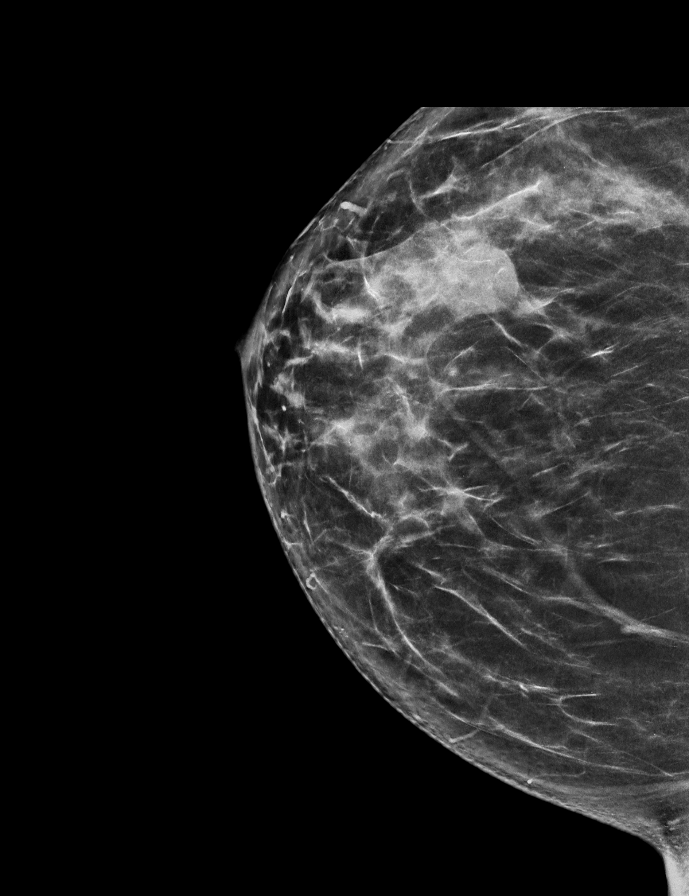

[L MLO synth-2D]
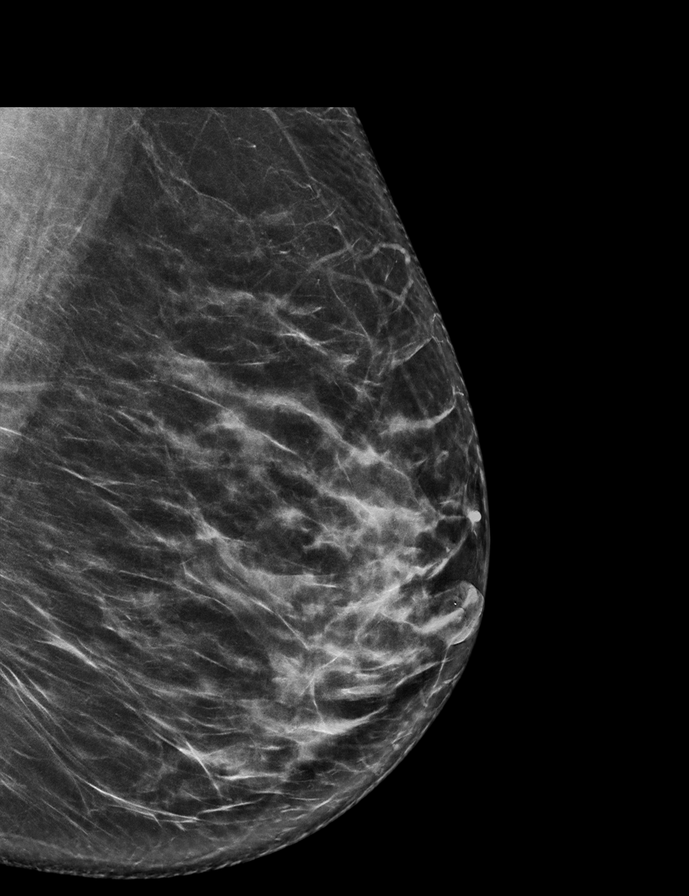

[L CC synth-2D]
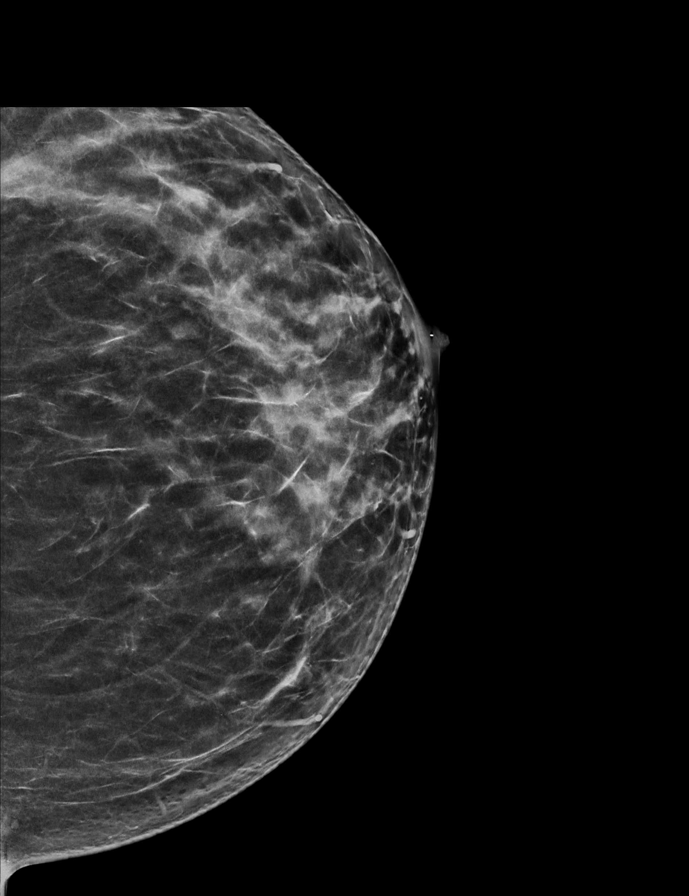

[R MLO synth-2D]
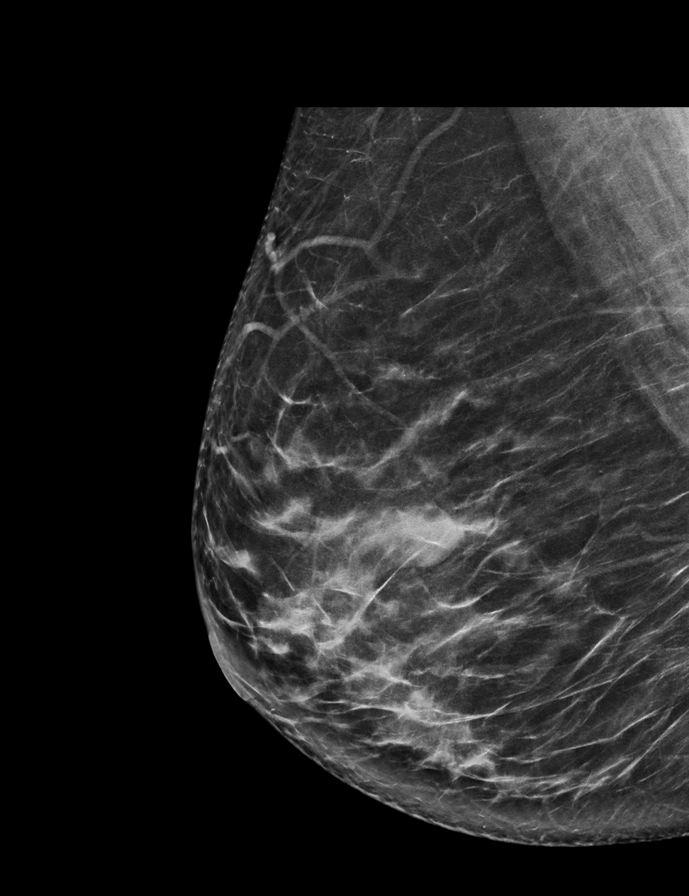

[L MLO tomo · 2 of 78 frames shown]
[frame 26/78]
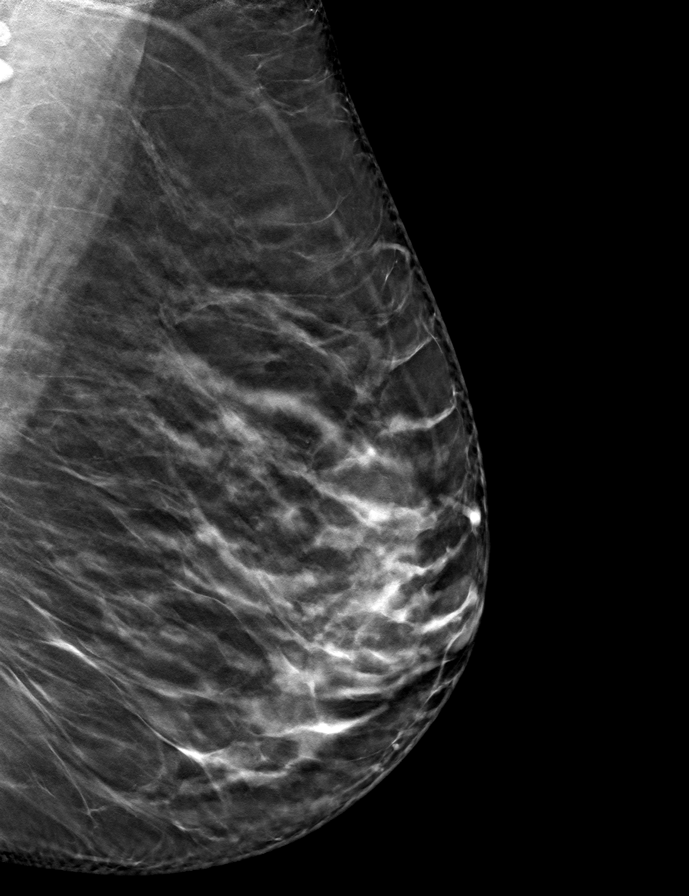
[frame 39/78]
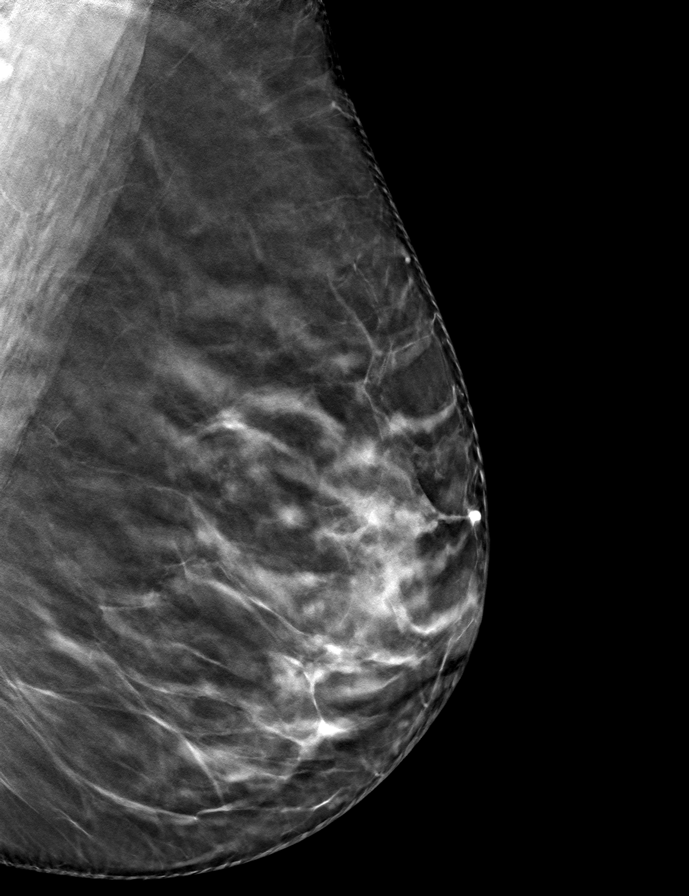

[L CC tomo · tomo slice 36/71.0]
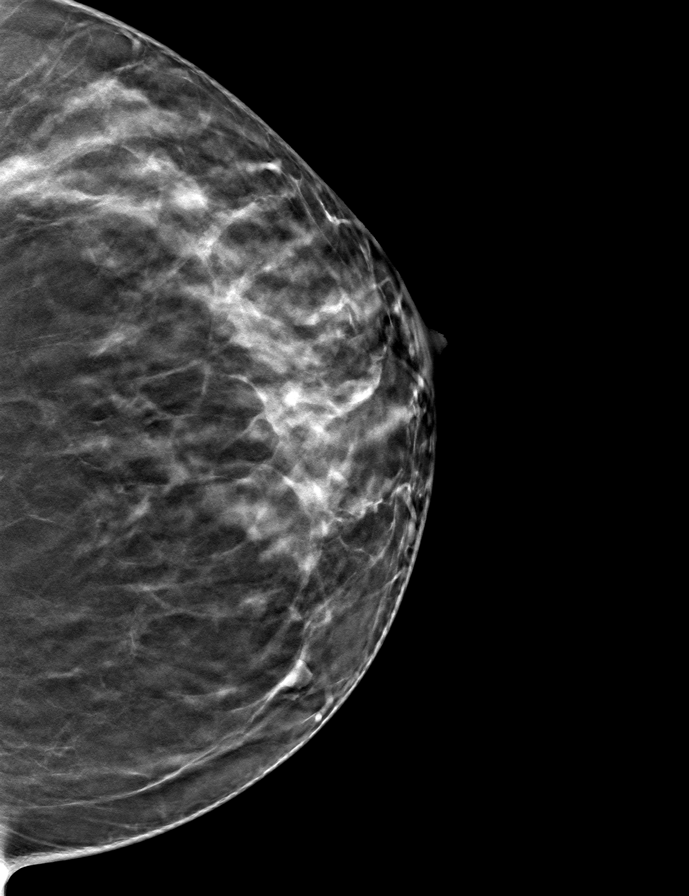

[R CC tomo · tomo slice 37/72.0]
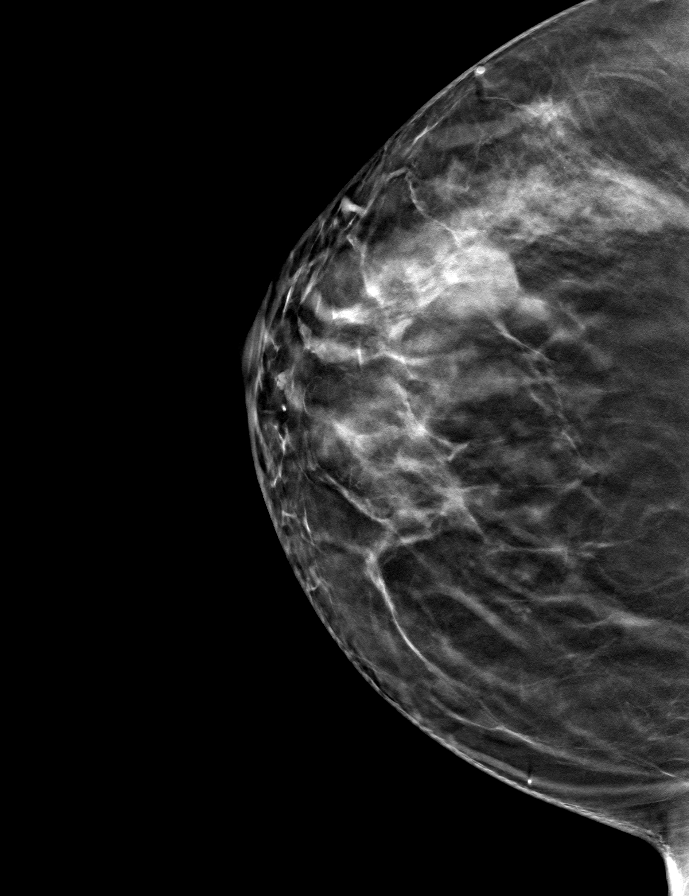

[R MLO tomo · tomo slice 39/77.0]
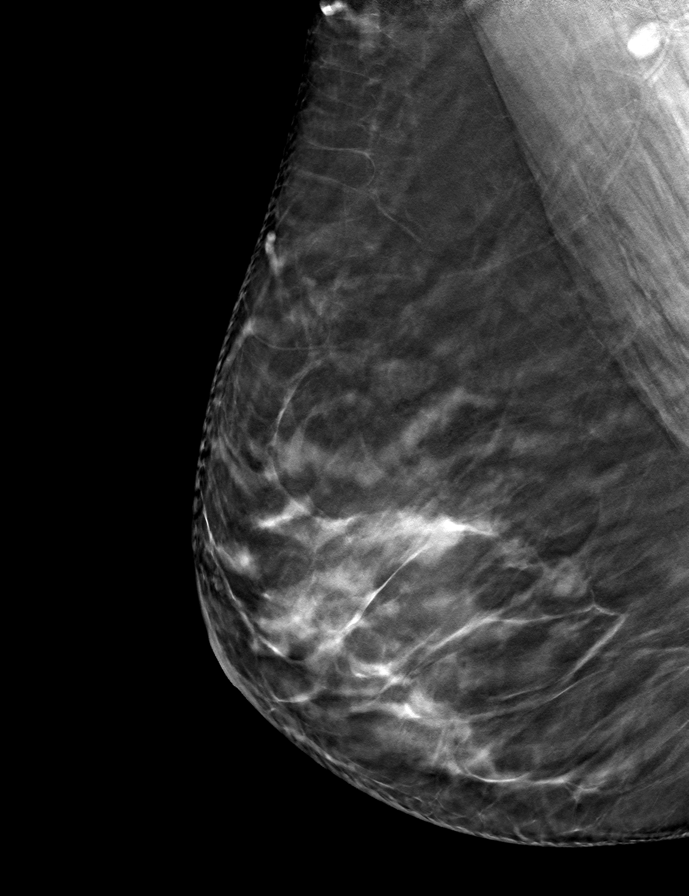

[9 of 24 positions shown; findings below may reference images not displayed]

ACR Breast Density Category c: The breast tissue is heterogeneously
dense, which may obscure small masses.
FINDINGS: There are no findings suspicious for malignancy. Images were
processed with CAD.
IMPRESSION: No mammographic evidence of malignancy. A result letter of this
screening mammogram will be mailed directly to the patient.

RECOMMENDATION:
Screening mammogram in one year. (Code:[id number])

BI-RADS CATEGORY  1: Negative.

## 2019-11-03 ENCOUNTER — Ambulatory Visit: Payer: No Typology Code available for payment source

## 2019-11-24 ENCOUNTER — Ambulatory Visit: Payer: Self-pay | Admitting: *Deleted

## 2019-11-24 ENCOUNTER — Other Ambulatory Visit: Payer: Self-pay

## 2019-11-24 ENCOUNTER — Ambulatory Visit
Admission: RE | Admit: 2019-11-24 | Discharge: 2019-11-24 | Disposition: A | Discharge status: Home / Self Care | Payer: No Typology Code available for payment source | Source: Ambulatory Visit | Attending: Obstetrics and Gynecology | Admitting: Obstetrics and Gynecology

## 2019-11-24 VITALS — BP 148/84 | Temp 97.1°F | Wt 153.2 lb

## 2019-11-24 DIAGNOSIS — Z1231 Encounter for screening mammogram for malignant neoplasm of breast: Secondary | ICD-10-CM

## 2019-11-24 DIAGNOSIS — Z01419 Encounter for gynecological examination (general) (routine) without abnormal findings: Secondary | ICD-10-CM

## 2019-11-24 NOTE — Patient Instructions (Addendum)
Explained breast self awareness with Smitty Knudsen. Pap smear completed today. Let her know BCCCP will cover Pap smears and HPV typing every 5 years unless has a history of abnormal Pap smears. Referred patient to the Breast Center of [facility] for a screening mammogram on the mobile unit. Appointment scheduled Thursday, November 24, 2019 at 1510. Patient escorted to mobile unit following BCCCP appointment for her screening mammogram. Let patient know will follow up with her within the next couple weeks with results of her Pap smear by letter or phone. Informed patient that the Breast Center will follow-up with her within the next couple of weeks with results of her mammogram by letter or phone. Smitty Knudsen verbalized understanding.  Greely Atiyeh, Kathaleen Maser, RN 2:38 PM

## 2019-11-24 NOTE — Progress Notes (Signed)
Christy [name] is a 44 y.o. G2P2 female who presents to [facility] clinic today with no complaints.    Pap Smear: Pap smear completed today. Last Pap smear was 08/08/2016 at the [facility] clinic and was normal. Per patient has no history of an abnormal Pap smear. Last Pap smear result is available in Epic.   Physical exam: Breasts Breasts symmetrical. No skin abnormalities bilateral breasts. Slight bilateral nipple retraction that per patient is normal for her. No nipple discharge bilateral breasts. No lymphadenopathy. No lumps palpated bilateral breasts. No complaints of pain or tenderness on exam.      Pelvic/Bimanual Ext Genitalia No lesions, no swelling and no discharge observed on external genitalia.        Vagina Vagina pink and normal texture. No lesions or discharge observed in vagina.        Cervix Cervix is present. Cervix pink and of normal texture. No discharge observed.    Uterus Uterus is present and palpable. Uterus in normal position and normal size.        Adnexae Bilateral ovaries present and palpable. No tenderness on palpation.         Rectovaginal No rectal exam completed today since patient had no rectal complaints. No skin abnormalities observed on exam.     Smoking History: Patient has never smoked.   Patient Navigation: Patient education provided. Access to services provided for patient through [place] program. Spanish interpreter Christy [name] from [facility] provided.   Breast and Cervical Cancer Risk Assessment: Patient does not have family history of breast cancer, known genetic mutations, or radiation treatment to the chest before age 52. Patient does not have history of cervical dysplasia, immunocompromised, or DES exposure in-utero.  Risk Assessment    Risk Scores      11/24/2019 10/29/2017   Last edited by: Christy Rutherford, LPN Stoney Bang H, LPN   5-year risk: 0.7 % 0.6 %   Lifetime risk: 9.4 % 9.6 %           A: BCCCP exam with pap smear No complaints.  P: Referred patient to the [facility] of [facility] for a screening mammogram on the mobile unit. Appointment scheduled Thursday, November 24, 2019 at 1510.  Christy Heidelberg, RN 11/24/2019 2:37 PM

## 2019-11-30 LAB — CYTOLOGY - PAP
Comment: NEGATIVE
Diagnosis: UNDETERMINED — AB
High risk HPV: NEGATIVE

## 2019-12-01 ENCOUNTER — Telehealth: Payer: Self-pay

## 2019-12-01 NOTE — Telephone Encounter (Signed)
Via [name], Spanish Interpreter, Patient informed Pap test revealed ASC-US with negative HPV, needs repeat pap in 1 year. Patient verbalized understanding.

## 2019-12-14 ENCOUNTER — Other Ambulatory Visit: Payer: Self-pay

## 2019-12-14 ENCOUNTER — Inpatient Hospital Stay: Payer: Self-pay | Attending: Obstetrics and Gynecology | Admitting: *Deleted

## 2019-12-14 VITALS — BP 110/82 | Ht 61.0 in | Wt 159.3 lb

## 2019-12-14 DIAGNOSIS — Z0000 Encounter for general adult medical examination without abnormal findings: Secondary | ICD-10-CM

## 2019-12-14 NOTE — Progress Notes (Addendum)
Wisewoman initial screening   Interpreter- [name], [place]   Clinical Measurement:  Vitals:   12/14/19 0907 12/14/19 1116  BP: 108/80 110/82   Fasting Labs Drawn Today, will review with patient when they result.   Medical History:  Patient states that she does not have high cholesterol, does not have high blood pressure and she does not have diabetes.  Medications:  Patient states that she does not take medication to lower cholesterol, blood pressure or blood sugar.  Patient does not take an aspirin a day to help prevent a heart attack or stroke.   Blood pressure, self measurement: Patient states that she does not measure blood pressure from home. She checks her blood pressure N/A. She shares her readings with a health care provider: N/A.   Nutrition: Patient states that on average she eats 3 cups of fruit and 4 cups of vegetables per day. Patient states that she does not eat fish at least 2 times per week. Patient eats more than half servings of whole grains. Patient drinks less than 36 ounces of beverages with added sugar weekly: yes. Patient is currently watching sodium or salt intake: yes. In the past 7 days patient has had 0 drinks containing alcohol. On average patient drinks 0 drinks containing alcohol per day.      Physical activity:  Patient states that she gets 75 minutes of moderate and 75 minutes of vigorous physical activity each week.  Smoking status:  Patient states that she has has never smoked .   Quality of life:  Over the past 2 weeks patient states that she had little interest or pleasure in doing things: not at all. She has been feeling down, depressed or hopeless:not at all.    Risk reduction and counseling:    Explained to patient about the daily recommendation for fruits and vegetables. Explained that the recommendation is for 2 cups of fruit and 3 cups of vegetables per day. Showed patient what one serving would look like. Explained the importance of heart  healthy fish in weekly diet. Gave recommendations for salmon, tuna, mackerel, sea bass or sardines twice a week. Discussed what whole grains are and how they are a part of a heart healthy diet. Gave suggestions for whole wheat bread or pasta, whole grain cereals, brown rice or oatmeal. Also spoke with patient about portion control and measuring out foods to get a more accurate idea of how much patient is eating.   Navigation:  I will notify patient of lab results.  Patient is aware of 2 more health coaching sessions and a follow up. Provided patient with transportation to get home from appointment.  Time: 25 minutes

## 2019-12-15 LAB — LIPID PANEL
Chol/HDL Ratio: 3.4 ratio (ref 0.0–4.4)
Cholesterol, Total: 175 mg/dL (ref 100–199)
HDL: 52 mg/dL (ref >39)
LDL Chol Calc (NIH): 97 mg/dL (ref 0–99)
Triglycerides: 151 mg/dL — ABNORMAL HIGH (ref 0–149)
VLDL Cholesterol Cal: 26 mg/dL (ref 5–40)

## 2019-12-15 LAB — HEMOGLOBIN A1C
Est. average glucose Bld gHb Est-mCnc: 111 mg/dL
Hgb A1c MFr Bld: 5.5 % (ref 4.8–5.6)

## 2019-12-15 LAB — GLUCOSE, RANDOM: Glucose: 88 mg/dL (ref 65–99)

## 2019-12-19 ENCOUNTER — Telehealth: Payer: Self-pay

## 2019-12-19 NOTE — Telephone Encounter (Signed)
Health coaching 2   interpreter- [name], [employer]   Labs- 175 cholesterol, 97 LDL cholesterol, 151 triglycerides, 52 HDL cholesterol, 5.5 hemoglobin A1C, 88 mean plasma glucose. Patient understands and is aware of her lab results.    Goals-    1. Reduce the amount of fried and fatty foods consumed. 2. Avoid trans fats and foods high in saturated fat. 3. Reduce the amount of sugary foods and beverages consumed. 4. Increase the amount of healthy fats consumed. 5. Increase the amount of whole grains and fiber rich foods consumed.  Encouraged patient to continue with exercise routine/ daily exercise.    Navigation:  Patient is aware of 1 more health coaching sessions and a follow up.   Time- 10 minutes

## 2020-10-22 ENCOUNTER — Other Ambulatory Visit: Payer: Self-pay

## 2020-10-22 DIAGNOSIS — Z1231 Encounter for screening mammogram for malignant neoplasm of breast: Secondary | ICD-10-CM

## 2020-10-25 ENCOUNTER — Other Ambulatory Visit: Payer: Self-pay | Admitting: Obstetrics and Gynecology

## 2020-10-25 DIAGNOSIS — Z1231 Encounter for screening mammogram for malignant neoplasm of breast: Secondary | ICD-10-CM

## 2020-12-13 ENCOUNTER — Other Ambulatory Visit: Payer: Self-pay

## 2020-12-13 ENCOUNTER — Ambulatory Visit: Payer: Self-pay | Admitting: *Deleted

## 2020-12-13 ENCOUNTER — Encounter (INDEPENDENT_AMBULATORY_CARE_PROVIDER_SITE_OTHER): Payer: Self-pay

## 2020-12-13 ENCOUNTER — Ambulatory Visit
Admission: RE | Admit: 2020-12-13 | Discharge: 2020-12-13 | Disposition: A | Discharge status: Home / Self Care | Payer: No Typology Code available for payment source | Source: Ambulatory Visit | Attending: Obstetrics & Gynecology | Admitting: Obstetrics & Gynecology

## 2020-12-13 VITALS — BP 138/86 | Wt 155.5 lb

## 2020-12-13 DIAGNOSIS — Z1239 Encounter for other screening for malignant neoplasm of breast: Secondary | ICD-10-CM

## 2020-12-13 NOTE — Progress Notes (Signed)
[name] is a 45 y.o. G2P2 female who presents to [facility] clinic today with no complaints.    Pap Smear: Pap smear not completed today. Last Pap smear was 11/24/2019 at [facility] clinic and was abnormal - ASCUS with negative HPV . Per patient has no history of an abnormal Pap smear prior to her most recent Pap smear. Per ASCCP guidelines next Pap smear due in 3 years. Last Pap smear result is available in Epic.   Physical exam: Breasts Breasts symmetrical. No skin abnormalities bilateral breasts. No nipple retraction bilateral breasts. No nipple discharge bilateral breasts. No lymphadenopathy. No lumps palpated bilateral breasts. Complaints of mild right outer breast tenderness on exam.     MS DIGITAL SCREENING BILATERAL  Result Date: 09/03/2016 CLINICAL DATA:  Screening. EXAM: DIGITAL SCREENING BILATERAL MAMMOGRAM WITH CAD COMPARISON:  None. ACR Breast Density Category c: The breast tissue is heterogeneously dense, which may obscure small masses. FINDINGS: In the right breast, a possible mass warrants further evaluation. In the left breast, no findings suspicious for malignancy. Images were processed with CAD. IMPRESSION: Further evaluation is suggested for possible mass in the right breast. RECOMMENDATION: Diagnostic mammogram and possibly ultrasound of the right breast. (Code:[id number]) The patient will be contacted regarding the findings, and additional imaging will be scheduled. BI-RADS CATEGORY  0: Incomplete. Need additional imaging evaluation and/or prior mammograms for comparison. Electronically Signed   By: Edwin Cap M.D.   On: 09/03/2016 09:03   MM DIAG BREAST TOMO UNI RIGHT  Result Date: 09/30/2016 CLINICAL DATA:  The patient returns after baseline 2D screening study for evaluation of a possible right breast mass. EXAM: 2D DIGITAL DIAGNOSTIC RIGHT MAMMOGRAM WITH CAD AND ADJUNCT TOMO ULTRASOUND RIGHT BREAST COMPARISON:  09/02/2016 ACR Breast Density Category c: The breast  tissue is heterogeneously dense, which may obscure small masses. FINDINGS: Additional 2-D and 3-D images are performed. These views confirm presence of a circumscribed partially obscured mass in the lateral portion of the right breast further evaluated with ultrasound. Mammographic images were processed with CAD. Targeted ultrasound is performed, showing a simple cyst in the 930 o'clock location of the right breast 2 cm from nipple which measures 1.1 x 0.7 x 1.4 cm. No associated solid component or areas of acoustic shadowing are identified in this region. IMPRESSION: Persistent abnormality represents a simple cyst by ultrasound. No mammographic or ultrasound evidence for malignancy. RECOMMENDATION: Screening mammogram in one year.(Code:[id number]) I have discussed the findings and recommendations with the patient through an interpreter. Results were also provided in writing at the conclusion of the visit. If applicable, a reminder letter will be sent to the patient regarding the next appointment. BI-RADS CATEGORY  2: Benign. Electronically Signed   By: Norva Pavlov M.D.   On: 09/30/2016 09:30   MS DIGITAL SCREENING TOMO BILATERAL  Result Date: 11/28/2019 CLINICAL DATA:  Screening. EXAM: DIGITAL SCREENING BILATERAL MAMMOGRAM WITH TOMO AND CAD COMPARISON:  Previous exam(s). ACR Breast Density Category b: There are scattered areas of fibroglandular density. FINDINGS: There are no findings suspicious for malignancy. Images were processed with CAD. IMPRESSION: No mammographic evidence of malignancy. A result letter of this screening mammogram will be mailed directly to the patient. RECOMMENDATION: Screening mammogram in one year. (Code:[id number]) BI-RADS CATEGORY  1: Negative. Electronically Signed   By: Sherian Rein M.D.   On: 11/28/2019 13:55   MS DIGITAL SCREENING TOMO BILATERAL  Result Date: 10/29/2017 CLINICAL DATA:  Screening. EXAM: DIGITAL SCREENING BILATERAL MAMMOGRAM WITH TOMO AND CAD COMPARISON:  Previous exam(s). ACR Breast Density Category c: The breast tissue is heterogeneously dense, which may obscure small masses. FINDINGS: There are no findings suspicious for malignancy. Images were processed with CAD. IMPRESSION: No mammographic evidence of malignancy. A result letter of this screening mammogram will be mailed directly to the patient. RECOMMENDATION: Screening mammogram in one year. (Code:[id number]) BI-RADS CATEGORY  1: Negative. Electronically Signed   By: Sande Brothers M.D.   On: 10/29/2017 14:54    Pelvic/Bimanual Pap is not indicated today per BCCCP and ASCCP guidelines.   Smoking History: Patient has never smoked.   Patient Navigation: Patient education provided. Access to services provided for patient through [place] program. Spanish interpreter [name] from [facility] provided. Transportation provided home from [employer] and [employer].   Breast and Cervical Cancer Risk Assessment: Patient does not have family history of breast cancer, known genetic mutations, or radiation treatment to the chest before age 10. Patient does not have history of cervical dysplasia, immunocompromised, or DES exposure in-utero.  Risk Assessment     Risk Scores       12/13/2020 11/24/2019   Last edited by: Narda Rutherford, LPN [place], Sherie Demetrius Charity, LPN   5-year risk: 0.7 % 0.7 %   Lifetime risk: 9.4 % 9.4 %            A: BCCCP exam without pap smear No complaints.  P: Referred patient to the [facility] of [facility] for a screening mammogram on mobile unit. Appointment scheduled Thursday, December 13, 2020 at 1530.  [name], RN 12/13/2020 2:58 PM

## 2020-12-13 NOTE — Patient Instructions (Signed)
Explained breast self awareness with Christy [name]. Patient did not need a Pap smear today due to last Pap smear was 11/24/2019. Let her know that based on her last Pap smear result that her next Pap smear is due in 3 years. Referred patient to the [facility] of [facility] for a screening mammogram on mobile unit. Appointment scheduled Thursday, December 13, 2020 at 1530. Patient escorted to the mobile unit following BCCCP appointment for her screening mammogram. Let patient know the Breast Center will follow up with her within the next couple weeks with results of her mammogram by letter or phone. Christy [name] verbalized understanding.  Declan Adamson, Kathaleen Maser, RN 2:58 PM

## 2021-11-12 ENCOUNTER — Other Ambulatory Visit: Payer: Self-pay | Admitting: Obstetrics and Gynecology

## 2021-11-12 DIAGNOSIS — Z1231 Encounter for screening mammogram for malignant neoplasm of breast: Secondary | ICD-10-CM

## 2021-12-26 ENCOUNTER — Ambulatory Visit: Payer: Self-pay | Admitting: Hematology and Oncology

## 2021-12-26 ENCOUNTER — Ambulatory Visit
Admission: RE | Admit: 2021-12-26 | Discharge: 2021-12-26 | Disposition: A | Discharge status: Home / Self Care | Payer: No Typology Code available for payment source | Source: Ambulatory Visit | Attending: Obstetrics and Gynecology | Admitting: Obstetrics and Gynecology

## 2021-12-26 VITALS — BP 198/89 | Wt 164.0 lb

## 2021-12-26 DIAGNOSIS — Z1231 Encounter for screening mammogram for malignant neoplasm of breast: Secondary | ICD-10-CM

## 2021-12-26 DIAGNOSIS — Z1211 Encounter for screening for malignant neoplasm of colon: Secondary | ICD-10-CM

## 2021-12-26 NOTE — Progress Notes (Signed)
Taught Smitty Knudsen . Patient did not need a Pap smear today due to last Pap smear was in 2021 per patient. Most recent pap revealed ASCUS with HPV-. She will be due again in 2024. Let her know BCCCP will cover Pap smears every 5 years unless has a history of abnormal Pap smears. Referred patient to the [facility] of [facility] for screening mammogram. Appointment scheduled for 12/26/21. Patient aware of appointment and will be there. Let patient know will follow up with her within the next couple weeks with results. Smitty Knudsen verbalized understanding.  Pascal Lux, NP 12/26/21 11:13 AM

## 2022-04-23 ENCOUNTER — Inpatient Hospital Stay: Payer: No Typology Code available for payment source | Attending: Obstetrics and Gynecology | Admitting: *Deleted

## 2022-04-23 ENCOUNTER — Other Ambulatory Visit: Payer: Self-pay

## 2022-04-23 VITALS — BP 186/110 | Ht 61.0 in | Wt 147.8 lb

## 2022-04-23 DIAGNOSIS — Z0000 Encounter for general adult medical examination without abnormal findings: Secondary | ICD-10-CM

## 2022-04-23 LAB — FECAL OCCULT BLOOD, IMMUNOCHEMICAL: Fecal Occult Bld: NEGATIVE

## 2022-04-23 NOTE — Progress Notes (Signed)
Wisewoman initial [place], [employer]    Clinical Measurement: There were no vitals filed for this visit. Fasting Labs Drawn Today, will review with patient when they result.   Medical History: Patient states that she does not have high cholesterol, has high blood pressure and she does not have diabetes.  Medications: Patient states that she does not take medication to lower cholesterol, blood pressure or blood sugar.  Patient does not take an aspirin a day to help prevent a heart attack or stroke.    Blood pressure, self measurement: Patient states that she does not have the equipment to measure blood pressure from home. She checks her blood pressure N/A. She shares her readings with a health care provider: N/A.   Nutrition: Patient states that on average she eats 1 cups of fruit and 1 cups of vegetables per day. Patient states that she does not eat fish at least 2 times per week. Patient eats about half servings of whole grains. Patient drinks less than 36 ounces of beverages with added sugar weekly: yes. Patient is currently watching sodium or salt intake: yes. In the past 7 days patient has consumed drinks containing alcohol on 0 days. On a day that patient consumes drinks containing alcohol on average 0 drinks are consumed.      Physical activity: Patient states that she gets 90 minutes of moderate and 90 minutes of vigorous physical activity each week.  Smoking status: Patient states that she has has never smoked .   Quality of life: Over the past 2 weeks patient states that she had little interest or pleasure in doing things: not at all. She has been feeling down, depressed or hopeless:not at all.   Social Determinants of Health Assessment:   Computer Use: During the last 12 months patient states that she has used any of the following: desktop/laptop, smart phone or tablet/other portable wireless computer: yes.   Internet Use: During the last 12 months, did  you or any member of your household have access to the internet: Yes, by paying a cell phone company or internet service provider.   Food Insecurities: During the last 12 months, where there any times when you were worried that you would run out of food because of a lack of money or other resources: No.   Transportation Barriers: During the last 12 months, have you missed a doctor's appointment because of transportation problems: No.   Childcare Barriers: If you are currently using childcare services, please identify  the type of services you use. (If not using childcare services, please select "Not applicable"): not applicable. During the last 12 months, have you had any barriers to childcare services such as: not applicable.   Housing: What is your housing situation today: I have housing.   Intimate Partner Violence: During the last 12 months, how often did your partner physically hurt you: never. During the last 12 months, how often did your partner insult you or talk down to you: never.  Medication Adherence: During the last 12 months, did you ever forget to take your medicine: not applicable. During the last 12 months, were you careless ar times about taking your medicine: not applicable. During the last 12 months, when you felt better did you sometimes stop taking your medication: not applicable. During the last 12 months, sometimes if you felt worse when you took your medicine did you stop taking it: not applicable.   Risk reduction and counseling:   Heart Wise: Explained  HeartWise program to patient. Obtained consent form. Showed patient how to use blood pressure monitor. Patient then showed how to measure blood pressure independently. Showed patient how to track blood pressure using log sheets. Instructed patient to take tracking logs with her to her FU appointment. Gave patient educational brochures on hypertension.     Navigation:  I will notify patient of lab results.  Patient is aware  of 2 more health coaching sessions and a follow up. Patient is scheduled for follow-up with Internal Medicine on Wednesday, March 13 @ 8:45 am.  Time: 50 minutes

## 2022-04-24 ENCOUNTER — Inpatient Hospital Stay: Payer: Self-pay

## 2022-04-24 LAB — LIPID PANEL
Chol/HDL Ratio: 2.9 ratio (ref 0.0–4.4)
Cholesterol, Total: 183 mg/dL (ref 100–199)
HDL: 64 mg/dL (ref >39)
LDL Chol Calc (NIH): 102 mg/dL — ABNORMAL HIGH (ref 0–99)
Triglycerides: 94 mg/dL (ref 0–149)
VLDL Cholesterol Cal: 17 mg/dL (ref 5–40)

## 2022-04-24 LAB — HEMOGLOBIN A1C
Est. average glucose Bld gHb Est-mCnc: 114 mg/dL
Hgb A1c MFr Bld: 5.6 % (ref 4.8–5.6)

## 2022-04-24 LAB — GLUCOSE, RANDOM: Glucose: 85 mg/dL (ref 70–99)

## 2022-04-25 NOTE — Progress Notes (Addendum)
   CC: Hypertension  HPI:   [name] is a 47 y.o. female without a significant past medical history who presents for wellness visit. She was referred for elevated blood pressure and review of laboratory tests collected 3-13.   Medical History: Knee pain bilateral  Current Medications: Betamethasone injections every month  Surgical History: C-section   Occupation: Unemployed  Allergies: none  Home: Husband and children, feels safe  Drugs: EtOH - none Tobacco - none Marijuana - none Cocaine - none  Sex: Last menstrual cycle eight months ago, likely currently experiencing menopause     No past medical history on file.   Review of Systems:    Reports chronic bilateral knee pain Denies  lightheadedness, dizziness, blurry vision, headache, anxiety, chest pain, palpitations    Physical Exam:  Vitals:   04/30/22 0846 04/30/22 0853  BP: (!) 146/78 (!) 148/78  Pulse: (!) 56 (!) 53  Resp: (!) 24   Temp: 98.2 F (36.8 C)   TempSrc: Oral   SpO2: 100%   Weight: 146 lb 8 oz (66.5 kg)   Height: 5' (1.524 m)     General:   awake and alert, sitting comfortably in chair, cooperative, not in acute distress Skin:   warm and dry, intact without any obvious lesions or scars, no rashes Lungs:   normal respiratory effort, breathing unlabored, symmetrical chest rise, no crackles or wheezing Cardiac:   regular rate and rhythm, normal S1 and S2 Neurologic:   oriented to person-place-time, moving all extremities, no gross focal deficits Psychiatric:   euthymic mood with congruent affect, intelligible speech, communication via Spanish interpretor     Assessment & Plan:   Hypertension Patient recently completed well adult health check and BP was 186/110. Presented log of home BP values, overall average around 150s systolic. Today in clinic, her BP was 146/78 and repeat 148/78. Denies lightheadedness, dizziness, blurry vision, headache, anxiety, chest pain,  and palpitations. Discussed starting new medication to lower blood pressure. Amlodipine was selected given low-cost through IM Program.   - Start amlodipine 5mg  q24 - Check BMP - Continue tracking BP values at home, bring log to next [facility] visit in one month    Hyperlipidemia Patient recently completed well adult health check and lipid panel revealed LDL 102. Based on previous vital signs, her ASCVD ten-year risk for cardiovascular event is 1.1%. Blood pressure was only mildly elevated in clinic today, current ten-year risk is 0.7%. Counseling was provided on specific lifestyle changes such as limiting intake of foods high in saturated fats and cholesterol. Encouraged patient to exercise at moderate intensity for ? each week.  - Encourage lifestyle modifications      See Encounters Tab for problem based charting.  Patient discussed with Dr. Oswaldo Done

## 2022-04-28 ENCOUNTER — Telehealth: Payer: Self-pay

## 2022-04-28 NOTE — Telephone Encounter (Signed)
Health coaching 2   interpreter- Rudene Anda, [place] cholesterol, 102 LDL cholesterol, 94 triglycerides, 64 HDL cholesterol, 5.6 hemoglobin A1C, 85 mean plasma glucose. Patient understands and is aware of her lab  results.   Goals- Spoke with patient about watching the amount of fried and fatty foods consumed. Encouraged patient to decrease the amount of red meats that she consumes and to increase the amount of lean proteins that she consumes like chicken and fish. Encouraged patient to try and add heart healthy fish and whole grains into regular diet. Also encouraged patient to try and start exercising for 20-30 minutes daily.   Heart Wise: Checked in with patient about heart wise. Answered any questions that patient had regarding tracking blood pressure. Patient was confused about the log sheets. Will check in with patient again in one week regarding heart wise program.    Navigation:  Patient is aware of 1 more health coaching sessions and a follow up. Patient is scheduled for FU with Internal Medicine on 04/30/2022 @ 8:45 am.   Time- 25 minutes

## 2022-04-28 NOTE — Telephone Encounter (Signed)
Left message for patient via Erika McReynolds, [employer] about lab results from [facility]. Left name and number for patient to call back.   

## 2022-04-30 ENCOUNTER — Encounter: Payer: Self-pay | Admitting: Student

## 2022-04-30 ENCOUNTER — Other Ambulatory Visit: Payer: Self-pay

## 2022-04-30 ENCOUNTER — Other Ambulatory Visit (HOSPITAL_COMMUNITY): Payer: Self-pay

## 2022-04-30 ENCOUNTER — Ambulatory Visit (INDEPENDENT_AMBULATORY_CARE_PROVIDER_SITE_OTHER): Payer: Self-pay | Admitting: Student

## 2022-04-30 VITALS — BP 148/78 | HR 53 | Temp 98.2°F | Resp 24 | Ht 60.0 in | Wt 146.5 lb

## 2022-04-30 DIAGNOSIS — I10 Essential (primary) hypertension: Secondary | ICD-10-CM

## 2022-04-30 DIAGNOSIS — E785 Hyperlipidemia, unspecified: Secondary | ICD-10-CM | POA: Insufficient documentation

## 2022-04-30 MED ORDER — AMLODIPINE BESYLATE 5 MG PO TABS
5.0000 mg | ORAL_TABLET | Freq: Every day | ORAL | 11 refills | Status: DC
  Filled 2022-04-30: qty 30, 30d supply, fill #0

## 2022-04-30 NOTE — Assessment & Plan Note (Addendum)
Patient recently completed well adult health check and BP was 186/110. Presented log of home BP values, overall average around [id number] systolic. Today in clinic, her BP was 146/78 and repeat 148/78. Denies lightheadedness, dizziness, blurry vision, headache, anxiety, chest pain, and palpitations. Discussed starting new medication to lower blood pressure. Amlodipine was selected given low-cost through IM Program.   - Start amlodipine 5mg  q24 - Check BMP - Continue tracking BP values at home, bring log to next [facility] visit in one month

## 2022-04-30 NOTE — Patient Instructions (Signed)
  Thank you, Ms.Pershing Proud, for allowing [place] to provide your care today. Today we discussed . . .  > Hypertension       - your blood pressure was elevated today, so we are starting you on a medication called amlodipine       - we are also checking some laboratory tests to evaluate your kidney function       - please start to take amlodipine every day and continue checking your blood pressure at home  > Hyperlipidemia       - your cholesterol levels are a little high       - continue to limit your intake of saturated fats and exercising 126min total each week    I have ordered the following labs for you:  Lab Orders         BMP8+Anion Gap       Tests ordered today:  none   Referrals ordered today:   Referral Orders  No referral(s) requested today      I have ordered the following medication/changed the following medications:   Stop the following medications: There are no discontinued medications.   Start the following medications: Meds ordered this encounter  Medications   amLODipine (NORVASC) 5 MG tablet    Sig: Take 1 tablet (5 mg total) by mouth daily.    Dispense:  30 tablet    Refill:  11      Follow up:  1 month     Remember:  Please start taking amlodipine and checking your blood pressure at home, bring a log of these values to your next visit on about one month!   Should you have any questions or concerns please call the internal medicine clinic at [phone].     Roswell Nickel, MD [place]

## 2022-04-30 NOTE — Assessment & Plan Note (Signed)
Patient recently completed well adult health check and lipid panel revealed LDL 102. Based on previous vital signs, her ASCVD ten-year risk for cardiovascular event is 1.1%. Blood pressure was only mildly elevated in clinic today, current ten-year risk is 0.7%. Counseling was provided on specific lifestyle changes such as limiting intake of foods high in saturated fats and cholesterol. Encouraged patient to exercise at moderate intensity for ?121min each week.  - Encourage lifestyle modifications

## 2022-05-01 LAB — BMP8+ANION GAP
Anion Gap: 16 mmol/L (ref 10.0–18.0)
BUN/Creatinine Ratio: 21 (ref 9–23)
BUN: 12 mg/dL (ref 6–24)
CO2: 22 mmol/L (ref 20–29)
Calcium: 10 mg/dL (ref 8.7–10.2)
Chloride: 104 mmol/L (ref 96–106)
Creatinine, Ser: 0.57 mg/dL (ref 0.57–1.00)
Glucose: 93 mg/dL (ref 70–99)
Potassium: 4.5 mmol/L (ref 3.5–5.2)
Sodium: 142 mmol/L (ref 134–144)
eGFR: 114 mL/min/{1.73_m2} (ref >59)

## 2022-05-01 NOTE — Progress Notes (Signed)
Internal Medicine Clinic Attending  Case discussed with Dr. Harper  At the time of the visit.  We reviewed the resident's history and exam and pertinent patient test results.  I agree with the assessment, diagnosis, and plan of care documented in the resident's note.  

## 2022-05-01 NOTE — Addendum Note (Signed)
Addended by: Lalla Brothers T on: 05/01/2022 03:41 PM   Modules accepted: Level of Service

## 2022-05-08 ENCOUNTER — Other Ambulatory Visit: Payer: Self-pay | Admitting: Student

## 2022-05-08 DIAGNOSIS — Z1231 Encounter for screening mammogram for malignant neoplasm of breast: Secondary | ICD-10-CM

## 2022-05-14 ENCOUNTER — Telehealth: Payer: Self-pay

## 2022-05-14 NOTE — Telephone Encounter (Addendum)
Called patient via Christy [name], Christy [name] to do a Heart[facility]ise check-in. Obtained BP readings that patient has taken and logged so far. Answered any questions that patient had regarding program. Went over medication adherence with patient since she was started on amlodipine during FU visit with Internal Medicine. Will check in with patient again in 2 weeks.  Time: 10 minutes

## 2022-05-26 ENCOUNTER — Telehealth: Payer: Self-pay

## 2022-05-26 NOTE — Telephone Encounter (Signed)
Spoke with patient via [name], Haroldine Laws to check-in for Heart Wise program. Made sure patient didn't have any questions or concerns regarding program. Will check in with patient again in the coming weeks to obtain remaining readings.

## 2022-06-02 ENCOUNTER — Encounter: Payer: No Typology Code available for payment source | Admitting: Student

## 2022-06-03 ENCOUNTER — Other Ambulatory Visit (HOSPITAL_COMMUNITY): Payer: Self-pay

## 2022-06-03 ENCOUNTER — Other Ambulatory Visit: Payer: Self-pay

## 2022-06-03 ENCOUNTER — Encounter: Payer: Self-pay | Admitting: Internal Medicine

## 2022-06-03 ENCOUNTER — Ambulatory Visit (INDEPENDENT_AMBULATORY_CARE_PROVIDER_SITE_OTHER): Payer: Self-pay | Admitting: Internal Medicine

## 2022-06-03 VITALS — BP 162/71 | HR 58 | Temp 98.0°F | Ht 61.0 in | Wt 146.2 lb

## 2022-06-03 DIAGNOSIS — M1990 Unspecified osteoarthritis, unspecified site: Secondary | ICD-10-CM

## 2022-06-03 DIAGNOSIS — Z0000 Encounter for general adult medical examination without abnormal findings: Secondary | ICD-10-CM

## 2022-06-03 DIAGNOSIS — D649 Anemia, unspecified: Secondary | ICD-10-CM | POA: Insufficient documentation

## 2022-06-03 DIAGNOSIS — Z5989 Other problems related to housing and economic circumstances: Secondary | ICD-10-CM

## 2022-06-03 DIAGNOSIS — Q741 Congenital malformation of knee: Secondary | ICD-10-CM | POA: Insufficient documentation

## 2022-06-03 DIAGNOSIS — I10 Essential (primary) hypertension: Secondary | ICD-10-CM

## 2022-06-03 DIAGNOSIS — Z139 Encounter for screening, unspecified: Secondary | ICD-10-CM

## 2022-06-03 MED ORDER — AMLODIPINE BESYLATE 5 MG PO TABS
5.0000 mg | ORAL_TABLET | Freq: Every day | ORAL | 11 refills | Status: DC
  Filled 2022-06-03: qty 30, 30d supply, fill #0

## 2022-06-03 MED ORDER — HYDROCHLOROTHIAZIDE 25 MG PO TABS
25.0000 mg | ORAL_TABLET | Freq: Every day | ORAL | 2 refills | Status: DC
  Filled 2022-06-03: qty 30, 30d supply, fill #0

## 2022-06-03 NOTE — Assessment & Plan Note (Signed)
Normocytic anemia dating back to 2008. No recent labs to trend. She is without complaint of dyspnea/fatigue and has no insurance so will hold off on further workup at this time.

## 2022-06-03 NOTE — Assessment & Plan Note (Signed)
PAP? 2021 ASCUS HPV-.  Due for repeat 11/2022  Mammogram normal 12/2021  Consider referral for colon, but this is less concerning

## 2022-06-03 NOTE — Assessment & Plan Note (Signed)
Patient without health insurance and limited funds. She is spanish speaking and having trouble navigating the healthcare system. I have placed referral to social work to assist with getting her set up with [facility] card and cafa paperwork to assist with medicines, labs, and referrals.

## 2022-06-03 NOTE — Assessment & Plan Note (Signed)
Patient complains of bilateral knee pain. She says this has been ongoing for years. Worsened with cold. She does report that while she was in [place] she would get knee injections monthly with Betamethasone and did this for several years.  On exam, she has bilateral crepitus. No erythema, edema, or effusion present today to suggest a flare and no joint line tenderness. Given her history and physical exam findings, I am concerned that she may have early OA from excessive steroid injections. She is without insurance so cannot confirm this with imaging at this time, but this is something to follow up on. I have counseled her against continued knee injections and recommended tylenol. Ice, and voltaren gel.

## 2022-06-03 NOTE — Patient Instructions (Addendum)
Dear Mrs. Perez-Millan,  Thank you for trusting [place] with your care. We discussed the blood pressure and knee pain.  Your blood pressure is still quite high. Please take amlodipine  and hydrochlorothiazide  for the blood pressure.  For the knee pain, I recommend diclofenac gel.  I have placed a referral to social work to help with the [facility] card paperwork. Please return in 1 month for a blood pressure check  Gracias por confiar en nosotros con su atencin. Hablamos de la presin arterial y Chief Technology Officer de rodilla.  Su presin arterial sigue siendo bastante alta. Tome amlodipino 5 mg e hidroclorotiazida 25 mg para la presin arterial.  Para el dolor de rodilla, recomiendo el gel de diclofenaco.  He puesto una referencia a trabajo social para ayudar con el papeleo de la tarjeta naranja. Regrese en 1 mes para un control de presin arterial

## 2022-06-03 NOTE — Progress Notes (Signed)
   CC: 1 month f/u  HPI:[name] is a 47 y.o. female who presents for evaluation of BP. Please see individual problem based A/P for details.  48 yo spanish speaking female without insurance Has follows with womens health  Depression, PHQ-9: Based on the patients  Flowsheet Row Office Visit from 06/03/2022 in [facility]  PHQ-9 Total Score 0      score we have .  No past medical history on file. Review of Systems:   See hpi  Physical Exam: Vitals:   06/03/22 0940 06/03/22 1016  BP: (!) 175/76 (!) 162/71  Pulse: (!) 58   Temp: 98 F (36.7 C)   TempSrc: Oral   SpO2: 100%   Weight: 146 lb 3.2 oz (66.3 kg)   Height:  (1.549 m)    General: nad HEENT: Conjunctiva nl , antiicteric sclerae, moist mucous membranes, no exudate or erythema Cardiovascular: Normal rate, regular rhythm.  No murmurs, rubs, or gallops Pulmonary : Equal breath sounds, No wheezes, rales, or rhonchi Abdominal: soft, nontender,  bowel sounds present Ext: No edema in lower extremities, no tenderness to palpation of lower extremities. Bilateral knee crepitus without edema or erythema or effusion  Assessment & Plan:   See Encounters Tab for problem based charting.  Hypertension Patient reports she has been taking amlodipine daily. Says she took yesterday. Has not taken yet this morning. She did keep a log, Bps prior to amlodipine systolics 150-190s, and 130-150s after starting amlodipine.  BP obtained in office remain elevated 170s and 160s on repeat. Amlodipine has long half life so should still have control. We will add on HCTZ  to her regimen and see her back in 1 month for bp check and BMP.   Arthritis Patient complains of bilateral knee pain. She says this has been ongoing for years. Worsened with cold. She does report that while she was in [place] she would get knee injections monthly with Betamethasone and did this for several years.  On exam, she has  bilateral crepitus. No erythema, edema, or effusion present today to suggest a flare and no joint line tenderness. Given her history and physical exam findings, I am concerned that she may have early OA from excessive steroid injections. She is without insurance so cannot confirm this with imaging at this time, but this is something to follow up on. I have counseled her against continued knee injections and recommended tylenol. Ice, and voltaren gel.   Normocytic anemia Normocytic anemia dating back to 2008. No recent labs to trend. She is without complaint of dyspnea/fatigue and has no insurance so will hold off on further workup at this time.   Healthcare maintenance PAP? 2021 ASCUS HPV-.  Due for repeat 11/2022  Mammogram normal 12/2021  Consider referral for colon, but this is less concerning   Insurance coverage problems Patient without health insurance and limited funds. She is spanish speaking and having trouble navigating the healthcare system. I have placed referral to social work to assist with getting her set up with [facility] card and cafa paperwork to assist with medicines, labs, and referrals.  Patient discussed with Dr. Cleda Daub

## 2022-06-03 NOTE — Assessment & Plan Note (Signed)
Patient reports she has been taking amlodipine daily. Says she took yesterday. Has not taken yet this morning. She did keep a log, Bps prior to amlodipine systolics 150-190s, and 130-150s after starting amlodipine.  BP obtained in office remain elevated 170s and 160s on repeat. Amlodipine has long half life so should still have control. We will add on HCTZ  to her regimen and see her back in 1 month for bp check and BMP.

## 2022-06-06 ENCOUNTER — Other Ambulatory Visit (HOSPITAL_COMMUNITY): Payer: Self-pay

## 2022-06-09 NOTE — Progress Notes (Signed)
Internal Medicine Clinic Attending  Case discussed with the resident at the time of the visit.  We reviewed the resident's history and exam and pertinent patient test results.  I agree with the assessment, diagnosis, and plan of care documented in the resident's note.  

## 2022-07-01 ENCOUNTER — Encounter: Payer: No Typology Code available for payment source | Admitting: Student

## 2022-07-16 ENCOUNTER — Other Ambulatory Visit (HOSPITAL_COMMUNITY): Payer: Self-pay

## 2022-07-16 ENCOUNTER — Ambulatory Visit (INDEPENDENT_AMBULATORY_CARE_PROVIDER_SITE_OTHER): Payer: Self-pay | Admitting: Student

## 2022-07-16 ENCOUNTER — Encounter: Payer: Self-pay | Admitting: Student

## 2022-07-16 VITALS — BP 134/61 | HR 57 | Temp 97.9°F | Wt 142.7 lb

## 2022-07-16 DIAGNOSIS — M1990 Unspecified osteoarthritis, unspecified site: Secondary | ICD-10-CM

## 2022-07-16 DIAGNOSIS — I10 Essential (primary) hypertension: Secondary | ICD-10-CM

## 2022-07-16 DIAGNOSIS — Z139 Encounter for screening, unspecified: Secondary | ICD-10-CM

## 2022-07-16 DIAGNOSIS — Q741 Congenital malformation of knee: Secondary | ICD-10-CM

## 2022-07-16 MED ORDER — ACETAMINOPHEN 500 MG PO TABS
1000.0000 mg | ORAL_TABLET | Freq: Three times a day (TID) | ORAL | 2 refills | Status: AC | PRN
  Filled 2022-07-16: qty 100, 17d supply, fill #0

## 2022-07-16 MED ORDER — AMLODIPINE BESYLATE 5 MG PO TABS
5.0000 mg | ORAL_TABLET | Freq: Every day | ORAL | 11 refills | Status: AC
Start: 2022-07-16 — End: 2023-07-16
  Filled 2022-07-16: qty 30, 30d supply, fill #0
  Filled 2022-08-22 (×2): qty 30, 30d supply, fill #1
  Filled 2022-10-08: qty 30, 30d supply, fill #2
  Filled 2022-12-05: qty 30, 30d supply, fill #3
  Filled 2023-02-05: qty 30, 30d supply, fill #4

## 2022-07-16 MED ORDER — HYDROCHLOROTHIAZIDE 25 MG PO TABS
25.0000 mg | ORAL_TABLET | Freq: Every day | ORAL | 11 refills | Status: AC
Start: 2022-07-16 — End: 2023-07-16
  Filled 2022-07-16: qty 30, 30d supply, fill #0
  Filled 2022-08-22 (×2): qty 30, 30d supply, fill #1
  Filled 2022-10-08: qty 30, 30d supply, fill #2
  Filled 2022-12-05: qty 30, 30d supply, fill #3

## 2022-07-16 MED ORDER — DICLOFENAC SODIUM 1 % EX GEL
4.0000 g | Freq: Four times a day (QID) | CUTANEOUS | 0 refills | Status: AC
  Filled 2022-07-16: qty 100, 7d supply, fill #0

## 2022-07-16 NOTE — Assessment & Plan Note (Signed)
Patient with history of hypertension presenting to [facility] for follow up. Currently on Amlodipine 5 mg and HCTZ 25 mg. Patient reports adherence to therapy, which she usually takes ~1PM. Expressed concerns that BP readings at home were elevated ~140-150/80 (hand written records reviewed). After inquirig about her home BP measuring technique and timing, discovered that patient was taking BP after exertion and without inproper technique.   Patient denies history of chest pain, shortness of breath, DOE, palpitations, changes in vision. Has been making changes in her diet with less simple sugars, more protein, and more fiber. Has been avoiding high salt and sodium foods and switching to dry legumes and fresh vegetables when able over canned options.  BP today at near goal. Discussed proper BP technique, and instructed patient to check BP after taking medications. Will not make changes today; refilled medications. Patient is to bring log in 1 month for follow up. - BMP today -Continue HCZT 25 mg, amlodipine 5 mg daily

## 2022-07-16 NOTE — Progress Notes (Signed)
Subjective:  CC: blood pressure and arthritis in her knees.  HPI:  [name] is a 47 y.o. female with a past medical history stated below and presents today for hypertension and arthritis follow up. Please see problem based assessment and plan for additional details.  No past medical history on file.  Current Outpatient Medications on File Prior to Visit  Medication Sig Dispense Refill   Multiple Vitamin (MULTIVITAMIN) capsule Take 1 capsule by mouth daily. (Patient not taking: Reported on 04/23/2022)     No current facility-administered medications on file prior to visit.    Family History  Problem Relation Age of Onset   Hyperlipidemia Mother    Heart attack Maternal Grandfather    Breast cancer Neg Hx     Social History   Socioeconomic History   Marital status: Single    Spouse name: Not on file   Number of children: 2   Years of education: Not on file   Highest education level: 6th grade  Occupational History   Not on file  Tobacco Use   Smoking status: Never   Smokeless tobacco: Never  Vaping Use   Vaping Use: Never used  Substance and Sexual Activity   Alcohol use: No   Drug use: No   Sexual activity: Yes    Partners: Male    Birth control/protection: Post-menopausal  Other Topics Concern   Not on file  Social History Narrative   ---- 2024 ----      Medical History:   Knee pain bilateral       Current Medications:   Betamethasone injections every month       Surgical History:   C-section        Occupation:   Unemployed       Allergies:   none       Home:   Husband and children, feels safe       Drugs:   EtOH - none   Tobacco - none   Marijuana - none   Cocaine - none   Social Determinants of Health   Financial Resource Strain: Not on file  Food Insecurity: No Food Insecurity (04/23/2022)   Hunger Vital Sign    Worried About Running Out of Food in the Last Year: Never true    Ran Out of Food in the Last Year: Never  true  Transportation Needs: No Transportation Needs (04/30/2022)   PRAPARE - Administrator, Civil Service (Medical): No    Lack of Transportation (Non-Medical): No  Recent Concern: Transportation Needs - Unmet Transportation Needs (04/23/2022)   PRAPARE - Administrator, Civil Service (Medical): Yes    Lack of Transportation (Non-Medical): Yes  Physical Activity: Not on file  Stress: Not on file  Social Connections: Moderately Integrated (04/30/2022)   Social Connection and Isolation Panel [NHANES]    Frequency of Communication with Friends and Family: More than three times a week    Frequency of Social Gatherings with Friends and Family: Never    Attends Religious Services: More than 4 times per year    Active Member of [employer] or Organizations: No    Attends Banker Meetings: Never    Marital Status: Living with partner  Intimate Partner Violence: Not At Risk (04/30/2022)   Humiliation, Afraid, Rape, and Kick questionnaire    Fear of Current or Ex-Partner: No    Emotionally Abused: No    Physically Abused: No    Sexually Abused: No  Review of Systems: ROS negative except for what is noted on the assessment and plan.  Objective:   Vitals:   07/16/22 0903  BP: 134/61  Pulse: (!) 57  Temp: 97.9 F (36.6 C)  TempSrc: Oral  SpO2: 100%  Weight: 142 lb 11.2 oz (64.7 kg)    Physical Exam: Constitutional: well-appearing woman sitting in chair, in no acute distress HENT: normocephalic atraumatic, mucous membranes moist Eyes: conjunctiva non-erythematous Neck: supple Cardiovascular: regular rate and rhythm, no m/r/g Pulmonary/Chest: normal work of breathing on room air, lungs clear to auscultation bilaterally Abdominal: soft, non-tender, non-distended MSK: normal bulk and tone. + knee Bilateral knees without tenderness to palpation or effusions along joint line. Crepitus with full ROM and without discomfort. Neurological: alert & oriented x  3, 5/5 strength in bilateral upper and lower extremities, normal gait Skin: warm and dry Psych: Pleasant mood and affect       07/16/2022    9:20 AM  Depression screen PHQ 2/9  Decreased Interest 0  Down, Depressed, Hopeless 0  PHQ - 2 Score 0  Altered sleeping 0  Tired, decreased energy 0  Change in appetite 0  Feeling bad or failure about yourself  0  Trouble concentrating 0  Moving slowly or fidgety/restless 0  Suicidal thoughts 0  PHQ-9 Score 0  Difficult doing work/chores Not difficult at all        No data to display           Assessment & Plan:   Hypertension Patient with history of hypertension presenting to [facility] for follow up. Currently on Amlodipine 5 mg and HCTZ 25 mg. Patient reports adherence to therapy, which she usually takes ~1PM. Expressed concerns that BP readings at home were elevated ~140-150/80 (hand written records reviewed). After inquirig about her home BP measuring technique and timing, discovered that patient was taking BP after exertion and without inproper technique.   Patient denies history of chest pain, shortness of breath, DOE, palpitations, changes in vision. Has been making changes in her diet with less simple sugars, more protein, and more fiber. Has been avoiding high salt and sodium foods and switching to dry legumes and fresh vegetables when able over canned options.  BP today at near goal. Discussed proper BP technique, and instructed patient to check BP after taking medications. Will not make changes today; refilled medications. Patient is to bring log in 1 month for follow up. - BMP today -Continue HCZT 25 mg, amlodipine 5 mg daily  Congenital valgus deformity of knee Chronic for 10 years. Patient reports that she gets ketorolac/Mexican NSAID brand injections ~ once monthly for bilateral knee pain. The pain is worse after prolonged physical activity. Denies recent swelling or impairment in her daily activities. Has not been taking over the  counter medications. Diclofenac gel helped intermittently.  On examination, noticed pronounced knee valgus deformity,  No tenderness to palpation or effusions along joint line. Crepitus with full ROM and without discomfort. No warmth or overlying lesions. No pain during gait examination.  There is no tenderness along medial aspect of the knee to suggest inflammation of bilateral medical hamstring tender with chronic knee valgus deformity. Crepitus without pain increases my clinical suspicion for osteoarthritis. Discussed treatment with Tylenol and topical gels and discontinuation of IM injections in her family. Will obtain bilateral knee films today to evaluate joint space for degenerative changes. Given chronicity of her gait abnormalities, likely from childhood, I suspect this has lead to increased stress on BL knees and  likely osteoarthritis. Patient may benefit from PT for strengthening exercises as this is also likely contributing to pain - BL Knee XR -Tylenol 1000 mg q8HR PRN - Diclofenal gel - Will consult with referral coordinator as patient has [facility] assistance program to inquire about PT coverage  Encounter for screening involving social determinants of health (SDoH) Discussed application for [employer]  (that will then give access to medications and acess to services outside of [place]) and [facility] Contact Mikle Bosworth, for assistance with application.   Patient will have coverage of [facility] specific assistance until 11/2022. When it lapses, patient has to incur a small medical bill to be able to reapply for this program.    Return in about 3 months (around 10/16/2022).  Patient discussed with Dr. Rosalia Hammers, MD [facility] Internal Medicine Program - PGY-1 07/16/2022, 5:48 PM

## 2022-07-16 NOTE — Assessment & Plan Note (Addendum)
Chronic for 10 years. Patient reports that she gets ketorolac/Mexican NSAID brand injections ~ once monthly for bilateral knee pain. The pain is worse after prolonged physical activity. Denies recent swelling or impairment in her daily activities. Has not been taking over the counter medications. Diclofenac gel helped intermittently.  On examination, noticed pronounced knee valgus deformity,  No tenderness to palpation or effusions along joint line. Crepitus with full ROM and without discomfort. No warmth or overlying lesions. No pain during gait examination.  There is no tenderness along medial aspect of the knee to suggest inflammation of bilateral medical hamstring tender with chronic knee valgus deformity. Crepitus without pain increases my clinical suspicion for osteoarthritis. Discussed treatment with Tylenol and topical gels and discontinuation of IM injections in her family. Will obtain bilateral knee films today to evaluate joint space for degenerative changes. Given chronicity of her gait abnormalities, likely from childhood, I suspect this has lead to increased stress on BL knees and likely osteoarthritis. Patient may benefit from PT for strengthening exercises as this is also likely contributing to pain - BL Knee XR -Tylenol 1000 mg q8HR PRN - Diclofenal gel - Will consult with referral coordinator as patient has [facility] assistance program to inquire about PT coverage

## 2022-07-16 NOTE — Assessment & Plan Note (Signed)
Discussed application for [employer]  (that will then give access to medications and acess to services outside of [place]) and [facility] - Mikle Bosworth, for assistance with application.   Patient will have coverage of [facility] specific assistance until 11/2022. When it lapses, patient has to incur a small medical bill to be able to reapply for this program.

## 2022-07-16 NOTE — Patient Instructions (Addendum)
Ms.Rohini Perez-Millan , gracias por permitirnos ayudarle hoy. Durante esta visita hemos hablado acerca de los siguientes asuntos medicos:  Hypertension - Por favor recoja sus medicamentos en la farmacia de [facility] en [address] - [place] los dos medicamentos en la [place] - tome su presion una hora despues de tomarse los medicaments - Siga cmabiando su dieta y incoporando el ejecicio  Artritis Por favor no se inyecte con personal de la comunidad [place] 1000 mg de tylenol ( acetaminofen) cada 8 horas solo cuando lo necesite Le envie la crema de diclofenaco para las [place] Reyne Dumas a llamar para hacerle rayos equis de las rodillas   [place] de [place]ud  El papel de [employer] da cobertura Orlene Och 2024 - traigalo con usted a todas las citas de [place] o cuando necsite servicios de [facility]  Le estoy dando informacion acerca de [facility] - esa es different y cubre medicamentos y servicios por fuera de [facility] - Llame a Jenene Slicker all numer [phone] hoy y digale que necesita ayuda con esta aplicacion   He ordenado los siguientes exmenes de laboratorio:  Lab Orders         BMP8+Anion Gap         Medicamentos:    Meds ordered this encounter  Medications   diclofenac Sodium (VOLTAREN) 1 % GEL    Sig: Apply 4 g topically 4 (four) times daily.    Dispense:  4 g    Refill:  0   acetaminophen (TYLENOL) 500 MG tablet    Sig: Take 2 tablets (1,000 mg total) by mouth every 8 (eight) hours as needed for moderate pain.    Dispense:  100 tablet    Refill:  2   hydrochlorothiazide (HYDRODIURIL) 25 MG tablet    Sig: Take 1 tablet (25 mg total) by mouth daily.    Dispense:  30 tablet    Refill:  11    IM program   amLODipine (NORVASC) 5 MG tablet    Sig: Take 1 tablet (5 mg total) by mouth daily.    Dispense:  30 tablet    Refill:  11    IM program     Cita de seguimiento: 2-3 months     Para contactarnos: Esperamos verte la prxima vez. Llame a nuestra  clnica al telefono: [phone] si tiene alguna pregunta o inquietud. El mejor horario para llamar es de lunes a viernes de 9 a. m. a 4 p. m. Si es fuera del horario de atencin o durante el fin de [place], llame al nmero principal del hospital y pregunte por el residente de guardia de medicina interna. Si necesita reposicin de medicamentos, por favor notifique a su farmacia con una semana de anticipacin y ellos nos enviarn una solicitud.  Gracias por confiar en nosotros para cuidar de usted!   Morene Crocker, MD [facility]

## 2022-07-17 NOTE — Progress Notes (Signed)
Internal Medicine Clinic Attending  Case discussed with Dr. Mapp  At the time of the visit.  We reviewed the resident's history and exam and pertinent patient test results.  I agree with the assessment, diagnosis, and plan of care documented in the resident's note.  

## 2022-07-21 ENCOUNTER — Telehealth: Payer: Self-pay

## 2022-07-21 NOTE — Telephone Encounter (Signed)
Health Coaching 3  interpreter- [name], [facility]   Spoke with patient to obtain remaining BP readings for the [facility] program. Patient stated that she took her log sheets with her to her last doctor's appointment and left them behind. Unable to obtain remaining readings due to this. Patient states that her BP is still high at times when she checks it at home. Patient has been taking her medication daily and at the same time every day. Patient will FU with her PCP in 2-3 months. Encouraged patient to continue watching her diet and her sodium intake and daily exercise for 20-30 minutes.    Navigation:  Patient is aware of  a follow up session. Patient is scheduled for FU appointment on 08/25/22.   Time-  10 minutes

## 2022-07-29 ENCOUNTER — Institutional Professional Consult (permissible substitution): Payer: No Typology Code available for payment source | Admitting: Licensed Clinical Social Worker

## 2022-08-05 ENCOUNTER — Ambulatory Visit (INDEPENDENT_AMBULATORY_CARE_PROVIDER_SITE_OTHER): Payer: Self-pay | Admitting: Licensed Clinical Social Worker

## 2022-08-05 DIAGNOSIS — Z5989 Other problems related to housing and economic circumstances: Secondary | ICD-10-CM

## 2022-08-07 NOTE — BH Specialist Note (Signed)
Patient no-showed today's appointment.  [facility] contacted patient from telephone number [phone]. [facility] left a  VM.   Patient will need to reschedule appointment by calling Internal medicine center [phone].  Tavaria Mackins, MSW, LCSW-A [facility] Direct Dial:[phone]  Fax [phone] Main Office Phone: [phone] [address] [place], [place] [address] Website: [facility]  Primary Care  , [place]    

## 2022-08-22 ENCOUNTER — Other Ambulatory Visit (HOSPITAL_COMMUNITY): Payer: Self-pay

## 2022-08-25 ENCOUNTER — Inpatient Hospital Stay: Payer: Self-pay

## 2022-08-25 DIAGNOSIS — Z0000 Encounter for general adult medical examination without abnormal findings: Secondary | ICD-10-CM

## 2022-10-08 ENCOUNTER — Other Ambulatory Visit (HOSPITAL_COMMUNITY): Payer: Self-pay

## 2022-12-05 ENCOUNTER — Other Ambulatory Visit (HOSPITAL_COMMUNITY): Payer: Self-pay

## 2022-12-30 ENCOUNTER — Other Ambulatory Visit: Payer: Self-pay | Admitting: Obstetrics and Gynecology

## 2022-12-30 DIAGNOSIS — Z1231 Encounter for screening mammogram for malignant neoplasm of breast: Secondary | ICD-10-CM

## 2023-01-07 NOTE — Addendum Note (Signed)
Addended by: Morene Crocker on: 01/07/2023 03:51 PM   Modules accepted: Orders

## 2023-02-05 ENCOUNTER — Other Ambulatory Visit (HOSPITAL_COMMUNITY): Payer: Self-pay

## 2023-02-24 ENCOUNTER — Encounter: Payer: No Typology Code available for payment source | Admitting: Student

## 2023-02-26 ENCOUNTER — Encounter: Payer: No Typology Code available for payment source | Admitting: Student

## 2023-03-18 ENCOUNTER — Encounter: Payer: Self-pay | Admitting: Obstetrics & Gynecology

## 2023-03-19 ENCOUNTER — Ambulatory Visit: Payer: Self-pay | Admitting: *Deleted

## 2023-03-19 ENCOUNTER — Ambulatory Visit
Admission: RE | Admit: 2023-03-19 | Discharge: 2023-03-19 | Disposition: A | Discharge status: Home / Self Care | Payer: No Typology Code available for payment source | Source: Ambulatory Visit | Attending: Obstetrics and Gynecology | Admitting: Obstetrics and Gynecology

## 2023-03-19 ENCOUNTER — Encounter: Payer: No Typology Code available for payment source | Admitting: Student

## 2023-03-19 VITALS — BP 167/86 | Wt 138.0 lb

## 2023-03-19 DIAGNOSIS — Z01419 Encounter for gynecological examination (general) (routine) without abnormal findings: Secondary | ICD-10-CM

## 2023-03-19 DIAGNOSIS — Z1231 Encounter for screening mammogram for malignant neoplasm of breast: Secondary | ICD-10-CM

## 2023-03-19 NOTE — Patient Instructions (Signed)
 Explained breast self awareness with Avelina Angus. Pap smear completed today. Let her know that her next Pap smear will be due based on the result of today's Pap smear due to her last Pap smear was abnormal. Referred patient to the [facility] of [facility] for a screening mammogram on mobile unit. Appointment scheduled Thursday, March 19, 2023 at 1130. Patient aware of appointment and will be there. Let patient know will follow up with her within the next couple weeks with results by letter or phone. Informed patient that the Breast Center will follow up with her within the next couple of weeks with results of her mammogram by letter or phone. Avelina Angus verbalized understanding.  Jonahtan Manseau, Wanda Ship, RN 11:01 AM

## 2023-03-19 NOTE — Progress Notes (Signed)
 [name] is a 48 y.o. G2P2 female who presents to [facility] clinic today with no complaints.    Pap Smear: Pap smear completed today. Last Pap smear was 11/24/2019 at [facility] clinic and was abnormal - ASCUS with negative HPV . Per patient has no history of an abnormal Pap smear prior to her most recent Pap smear. Per ASCCP guidelines next Pap smear due in 3 years. Last Pap smear result is available in Epic.    Physical exam: Breasts Breasts symmetrical. No skin abnormalities bilateral breasts. No nipple retraction bilateral breasts. No nipple discharge bilateral breasts. No lymphadenopathy. No lumps palpated bilateral breasts. No complaints of pain or tenderness on exam.      MS DIGITAL SCREENING TOMO BILATERAL Result Date: 12/30/2021 CLINICAL DATA:  Screening. EXAM: DIGITAL SCREENING BILATERAL MAMMOGRAM WITH TOMOSYNTHESIS AND CAD TECHNIQUE: Bilateral screening digital craniocaudal and mediolateral oblique mammograms were obtained. Bilateral screening digital breast tomosynthesis was performed. The images were evaluated with computer-aided detection. COMPARISON:  Previous exam(s). ACR Breast Density Category b: There are scattered areas of fibroglandular density. FINDINGS: There are no findings suspicious for malignancy. IMPRESSION: No mammographic evidence of malignancy. A result letter of this screening mammogram will be mailed directly to the patient. RECOMMENDATION: Screening mammogram in one year. (Code:SM-B-01Y) BI-RADS CATEGORY  1: Negative. Electronically Signed   By: Dobrinka  Dimitrova M.D.   On: 12/30/2021 14:22   MM 3D SCREEN BREAST BILATERAL Result Date: 12/14/2020 CLINICAL DATA:  Screening. EXAM: DIGITAL SCREENING BILATERAL MAMMOGRAM WITH TOMOSYNTHESIS AND CAD TECHNIQUE: Bilateral screening digital craniocaudal and mediolateral oblique mammograms were obtained. Bilateral screening digital breast tomosynthesis was performed. The images were evaluated with computer-aided  detection. COMPARISON:  Previous exam(s). ACR Breast Density Category b: There are scattered areas of fibroglandular density. FINDINGS: There are no findings suspicious for malignancy. IMPRESSION: No mammographic evidence of malignancy. A result letter of this screening mammogram will be mailed directly to the patient. RECOMMENDATION: Screening mammogram in one year. (Code:SM-B-01Y) BI-RADS CATEGORY  1: Negative. Electronically Signed   By: Reyes Phi M.D.   On: 12/14/2020 12:30   MS DIGITAL SCREENING TOMO BILATERAL Result Date: 11/28/2019 CLINICAL DATA:  Screening. EXAM: DIGITAL SCREENING BILATERAL MAMMOGRAM WITH TOMO AND CAD COMPARISON:  Previous exam(s). ACR Breast Density Category b: There are scattered areas of fibroglandular density. FINDINGS: There are no findings suspicious for malignancy. Images were processed with CAD. IMPRESSION: No mammographic evidence of malignancy. A result letter of this screening mammogram will be mailed directly to the patient. RECOMMENDATION: Screening mammogram in one year. (Code:SM-B-01Y) BI-RADS CATEGORY  1: Negative. Electronically Signed   By: Craig Farr M.D.   On: 11/28/2019 13:55   Pelvic/Bimanual Ext Genitalia No lesions, no swelling and no discharge observed on external genitalia.        Vagina Vagina pink and normal texture. No lesions or discharge observed in vagina.        Cervix Cervix is present. Cervix pink and of normal texture. No discharge observed.    Uterus Uterus is present and palpable. Uterus in normal position and normal size.        Adnexae Bilateral ovaries present and palpable. No tenderness on palpation.         Rectovaginal No rectal exam completed today since patient had no rectal complaints. No skin abnormalities observed on exam.     Smoking History: Patient has never smoked.   Patient Navigation: Patient education provided. Access to services provided for patient through [facility] program. Spanish interpreter [place]  [place]Re[place] from [facility] provided. Transportation provided home from [employer] and [employer].   Colorectal Cancer Screening: Per patient has never had colonoscopy completed. FIT Test completed 04/17/2022 given by [facility] that was negative. No complaints today.    Breast and Cervical Cancer Risk Assessment: Patient does not have family history of breast cancer, known genetic mutations, or radiation treatment to the chest before age 46. Patient does not have history of cervical dysplasia, immunocompromised, or DES exposure in-utero.  Risk Scores as of Encounter on 03/19/2023     [name]           5-year 1.11%   Lifetime 13.77%            Last calculated by Silas, Ansyi K, CMA on 03/19/2023 at 10:50 AM        A: [facility] exam with pap smear No complaints.  P: Referred patient to the [facility] for a screening mammogram on mobile unit. Appointment scheduled Thursday, March 19, 2023 at 1130.  Driscilla Wanda SQUIBB, RN 03/19/2023 11:01 AM

## 2023-03-27 LAB — CYTOLOGY - PAP
Comment: NEGATIVE
Diagnosis: NEGATIVE
High risk HPV: NEGATIVE

## 2024-03-10 ENCOUNTER — Other Ambulatory Visit: Payer: Self-pay

## 2024-03-10 DIAGNOSIS — N644 Mastodynia: Secondary | ICD-10-CM

## 2024-04-08 ENCOUNTER — Ambulatory Visit: Payer: Self-pay

## 2024-04-12 ENCOUNTER — Other Ambulatory Visit: Payer: Self-pay
